# Patient Record
Sex: Female | Born: 1980 | Race: White | Hispanic: No | Marital: Married | Smoking: Never smoker
Health system: Southern US, Community
[De-identification: ages and names within clinical notes are randomized; demographics above are authoritative.]

## PROBLEM LIST (undated history)

## (undated) DIAGNOSIS — E039 Hypothyroidism, unspecified: Secondary | ICD-10-CM

## (undated) DIAGNOSIS — K529 Noninfective gastroenteritis and colitis, unspecified: Secondary | ICD-10-CM

## (undated) DIAGNOSIS — C50919 Malignant neoplasm of unspecified site of unspecified female breast: Secondary | ICD-10-CM

## (undated) HISTORY — DX: Noninfective gastroenteritis and colitis, unspecified: K52.9

## (undated) HISTORY — DX: Hypothyroidism, unspecified: E03.9

## (undated) HISTORY — PX: BREAST RECONSTRUCTION: SHX9

## (undated) HISTORY — DX: Malignant neoplasm of unspecified site of unspecified female breast: C50.919

## (undated) HISTORY — PX: MASTECTOMY: SHX3

---

## 2005-06-22 ENCOUNTER — Ambulatory Visit: Payer: Self-pay | Admitting: Family Medicine

## 2006-10-17 ENCOUNTER — Inpatient Hospital Stay (HOSPITAL_COMMUNITY): Admission: RE | Admit: 2006-10-17 | Discharge: 2006-10-19 | Payer: Self-pay | Admitting: Obstetrics and Gynecology

## 2007-02-05 ENCOUNTER — Ambulatory Visit: Payer: Self-pay | Admitting: Family Medicine

## 2007-03-06 ENCOUNTER — Telehealth (INDEPENDENT_AMBULATORY_CARE_PROVIDER_SITE_OTHER): Payer: Self-pay | Admitting: *Deleted

## 2007-03-12 ENCOUNTER — Encounter: Payer: Self-pay | Admitting: Family Medicine

## 2007-04-05 ENCOUNTER — Encounter: Payer: Self-pay | Admitting: Family Medicine

## 2009-10-18 ENCOUNTER — Inpatient Hospital Stay (HOSPITAL_COMMUNITY): Admission: AD | Admit: 2009-10-18 | Discharge: 2009-10-19 | Payer: Self-pay | Admitting: Obstetrics

## 2009-11-04 ENCOUNTER — Inpatient Hospital Stay (HOSPITAL_COMMUNITY): Admission: AD | Admit: 2009-11-04 | Discharge: 2009-11-06 | Payer: Self-pay | Admitting: Obstetrics & Gynecology

## 2010-04-22 ENCOUNTER — Ambulatory Visit: Payer: Self-pay | Admitting: Family Medicine

## 2010-06-16 NOTE — Assessment & Plan Note (Signed)
Summary: CONGESTED/KN   Vital Signs:  Patient profile:   30 year old female Height:      64 inches Weight:      132.2 pounds BMI:     22.77 O2 Sat:      99 % on Room air Temp:     98.9 degrees F oral Pulse rate:   66 / minute Pulse rhythm:   regular BP sitting:   110 / 70  (left arm) Cuff size:   regular  Vitals Entered By: Almeta Monas CMA Duncan Dull) (April 22, 2010 1:07 PM)  O2 Flow:  Room air CC: x 2weeks c/o cough, sore/swollen lymph nodes in the neck, sinus pressure, and yellow sputum, URI symptoms   History of Present Illness:       This is a 30 year old woman who presents with URI symptoms.  The symptoms began 3 weeks ago.  Pt has been using neti pot with some relief of pressure.   Dry cough makes her chest burn.  The patient complains of nasal congestion, purulent nasal discharge, sore throat, dry cough, and sick contacts, but denies clear nasal discharge, productive cough, and earache.  The patient denies fever, low-grade fever (<100.5 degrees), fever of 100.5-103 degrees, fever of 103.1-104 degrees, fever to >104 degrees, stiff neck, dyspnea, wheezing, rash, vomiting, diarrhea, use of an antipyretic, and response to antipyretic.  The patient denies itchy watery eyes, itchy throat, sneezing, seasonal symptoms, response to antihistamine, headache, muscle aches, and severe fatigue.  The patient denies the following risk factors for Strep sinusitis: unilateral facial pain, unilateral nasal discharge, poor response to decongestant, double sickening, tooth pain, Strep exposure, tender adenopathy, and absence of cough.    Current Medications (verified): 1)  Prenatal Vitamins .Marland Kitchen.. 1 By Mouth Once Daily 2)  Synthroid 75 Mcg Tabs (Levothyroxine Sodium) .Marland Kitchen.. 1 By Mouth Once Daily 3)  Ceftin 500 Mg Tabs (Cefuroxime Axetil) .Marland Kitchen.. 1 By Mouth Two Times A Day 4)  Delsym 30 Mg/80ml Lqcr (Dextromethorphan Polistirex)  Allergies (verified): No Known Drug Allergies  Review of Systems  See HPI  Physical Exam  General:  Well-developed,well-nourished,in no acute distress; alert,appropriate and cooperative throughout examination Ears:  External ear exam shows no significant lesions or deformities.  Otoscopic examination reveals clear canals, tympanic membranes are intact bilaterally without bulging, retraction, inflammation or discharge. Hearing is grossly normal bilaterally. Nose:  External nasal examination shows no deformity or inflammation. Nasal mucosa are pink and moist without lesions or exudates. Mouth:  Oral mucosa and oropharynx without lesions or exudates.  Teeth in good repair. Neck:  No deformities, masses, or tenderness noted. Lungs:  + scattered rhonci occassional wheeze Heart:  normal rate and no murmur.   Cervical Nodes:  No lymphadenopathy noted Psych:  Cognition and judgment appear intact. Alert and cooperative with normal attention span and concentration. No apparent delusions, illusions, hallucinations   Impression & Recommendations:  Problem # 1:  BRONCHITIS- ACUTE (ICD-466.0)  Take antibiotics and other medications as directed. Encouraged to push clear liquids, get enough rest, and take acetaminophen as needed. To be seen in 5-7 days if no improvement, sooner if worse.  Her updated medication list for this problem includes:    Ceftin 500 Mg Tabs (Cefuroxime axetil) .Marland Kitchen... 1 by mouth two times a day    Delsym 30 Mg/39ml Lqcr (Dextromethorphan polistirex)  Complete Medication List: 1)  Prenatal Vitamins  .Marland KitchenMarland Kitchen. 1 by mouth once daily 2)  Synthroid 75 Mcg Tabs (Levothyroxine sodium) .Marland Kitchen.. 1 by mouth  once daily 3)  Ceftin 500 Mg Tabs (Cefuroxime axetil) .Marland Kitchen.. 1 by mouth two times a day 4)  Delsym 30 Mg/39ml Lqcr (Dextromethorphan polistirex)  Patient Instructions: 1)  Acute Bronchitis symptoms for less then 10 days are not  helped by antibiotics. Take over the counter cough medications. Call if no improvement in 5-7 days, sooner if increasing cough, fever,  or new symptoms ( shortness of breath, chest pain) .  Prescriptions: CEFTIN 500 MG TABS (CEFUROXIME AXETIL) 1 by mouth two times a day  #20 x 0   Entered and Authorized by:   Loreen Freud DO   Signed by:   Loreen Freud DO on 04/22/2010   Method used:   Electronically to        [facility] (retail)       [address]       [place], [place]  [address]       Ph: [phone]       Fax: [phone]   RxID:   [id number]    Orders Added: 1)  Est. Patient Level III [60630]

## 2010-07-31 LAB — CBC
Hemoglobin: 12.6 g/dL (ref 12.0–15.0)
MCV: 93.6 fL (ref 78.0–100.0)
Platelets: 188 10*3/uL (ref 150–400)
Platelets: 195 10*3/uL (ref 150–400)
RBC: 3.92 MIL/uL (ref 3.87–5.11)
RBC: 4.16 MIL/uL (ref 3.87–5.11)
RDW: 13.4 % (ref 11.5–15.5)
WBC: 12.4 10*3/uL — ABNORMAL HIGH (ref 4.0–10.5)
WBC: 15.3 10*3/uL — ABNORMAL HIGH (ref 4.0–10.5)

## 2011-03-02 LAB — CBC
HCT: 33.2 — ABNORMAL LOW
HCT: 39
Hemoglobin: 11.1 — ABNORMAL LOW
Hemoglobin: 13
MCHC: 33.3
MCV: 91.4
MCV: 92.6
Platelets: 194
RBC: 3.59 — ABNORMAL LOW
RBC: 4.27
WBC: 10.5
WBC: 19.1 — ABNORMAL HIGH

## 2011-03-16 ENCOUNTER — Ambulatory Visit (INDEPENDENT_AMBULATORY_CARE_PROVIDER_SITE_OTHER): Payer: BC Managed Care – PPO | Admitting: Family Medicine

## 2011-03-16 ENCOUNTER — Encounter: Payer: Self-pay | Admitting: Family Medicine

## 2011-03-16 DIAGNOSIS — R197 Diarrhea, unspecified: Secondary | ICD-10-CM

## 2011-03-16 DIAGNOSIS — R109 Unspecified abdominal pain: Secondary | ICD-10-CM

## 2011-03-16 LAB — BASIC METABOLIC PANEL
BUN: 5 mg/dL — ABNORMAL LOW (ref 6–23)
CO2: 29 mEq/L (ref 19–32)
Calcium: 9.7 mg/dL (ref 8.4–10.5)
Glucose, Bld: 80 mg/dL (ref 70–99)
Sodium: 138 mEq/L (ref 135–145)

## 2011-03-16 LAB — CBC WITH DIFFERENTIAL/PLATELET
Basophils Absolute: 0 10*3/uL (ref 0.0–0.1)
Eosinophils Absolute: 0.2 10*3/uL (ref 0.0–0.7)
Hemoglobin: 14.3 g/dL (ref 12.0–15.0)
Lymphocytes Relative: 22.9 % (ref 12.0–46.0)
Lymphs Abs: 2.1 10*3/uL (ref 0.7–4.0)
MCHC: 33.8 g/dL (ref 30.0–36.0)
Neutro Abs: 5.9 10*3/uL (ref 1.4–7.7)
Platelets: 256 10*3/uL (ref 150.0–400.0)
RDW: 12.7 % (ref 11.5–14.6)

## 2011-03-16 LAB — HEPATIC FUNCTION PANEL
Albumin: 4.6 g/dL (ref 3.5–5.2)
Alkaline Phosphatase: 63 U/L (ref 39–117)
Total Bilirubin: 1.1 mg/dL (ref 0.3–1.2)

## 2011-03-16 LAB — AMYLASE: Amylase: 41 U/L (ref 27–131)

## 2011-03-16 MED ORDER — HYOSCYAMINE SULFATE 0.125 MG SL SUBL: 0.1250 mg | SUBLINGUAL_TABLET | SUBLINGUAL | Status: DC | PRN

## 2011-03-16 NOTE — Progress Notes (Signed)
  Subjective:     Anyia Gierke is a 30 y.o. female who presents for evaluation of diarrhea. Onset of diarrhea was 6 days ago. Diarrhea is occurring approximately 8 times per day. Patient describes diarrhea as meconium-colored. Diarrhea has been associated with weight loss of 7 lbs since last week.. Patient denies blood in stool, fever, illness in household contacts, recent antibiotic use, recent camping, recent travel, significant abdominal pain. Previous visits for diarrhea: none. Evaluation to date: none.  Treatment to date: immodium 2x.  The following portions of the patient's history were reviewed and updated as appropriate: allergies, current medications, past family history, past medical history, past social history, past surgical history and problem list.  Review of Systems Pertinent items are noted in HPI.    Objective:    BP 100/68  Pulse 66  Temp(Src) 98.9 F (37.2 C) (Oral)  Wt 122 lb (55.339 kg)  SpO2 98% General: alert, cooperative, appears stated age and no distress  Hydration:  well hydrated  Abdomen:    normal findings: bowel sounds normal, no masses palpable, no organomegaly and soft and abnormal findings:  mild tenderness in the entire abdomen   heme neg brown stool Assessment:    Diarrhea of uncertain etiology, moderate in severity   Plan:    Appropriate educational material discussed and distributed. Clear liquids for a few days. Discussed the appropriate management of diarrhea. Follow up as needed. Lab studies per orders. Medications per orders.

## 2011-03-16 NOTE — Patient Instructions (Signed)
Diarrhea Infections caused by germs (bacterial) or a virus commonly cause diarrhea. Your caregiver has determined that with time, rest and fluids, the diarrhea should improve. In general, eat normally while drinking more water than usual. Although water may prevent dehydration, it does not contain salt and minerals (electrolytes). Broths, weak tea without caffeine and oral rehydration solutions (ORS) replace fluids and electrolytes. Small amounts of fluids should be taken frequently. Large amounts at one time may not be tolerated. Plain water may be harmful in infants and the elderly. Oral rehydrating solutions (ORS) are available at pharmacies and grocery stores. ORS replace water and important electrolytes in proper proportions. Sports drinks are not as effective as ORS and may be harmful due to sugars worsening diarrhea.  ORS is especially recommended for use in children with diarrhea. As a general guideline for children, replace any new fluid losses from diarrhea and/or vomiting with ORS as follows:   If your child weighs 22 pounds or under (10 kg or less), give 60-120 mL ( -  cup or 2 - 4 ounces) of ORS for each episode of diarrheal stool or vomiting episode.   If your child weighs more than 22 pounds (more than 10 kgs), give 120-240 mL ( - 1 cup or 4 - 8 ounces) of ORS for each diarrheal stool or episode of vomiting.   While correcting for dehydration, children should eat normally. However, foods high in sugar should be avoided because this may worsen diarrhea. Large amounts of carbonated soft drinks, juice, gelatin desserts and other highly sugared drinks should be avoided.   After correction of dehydration, other liquids that are appealing to the child may be added. Children should drink small amounts of fluids frequently and fluids should be increased as tolerated. Children should drink enough fluids to keep urine clear or pale yellow.   Adults should eat normally while drinking more fluids  than usual. Drink small amounts of fluids frequently and increase as tolerated. Drink enough fluids to keep urine clear or pale yellow. Broths, weak decaffeinated tea, lemon lime soft drinks (allowed to go flat) and ORS replace fluids and electrolytes.   Avoid:   Carbonated drinks.   Juice.   Extremely hot or cold fluids.   Caffeine drinks.   Fatty, greasy foods.   Alcohol.   Tobacco.   Too much intake of anything at one time.   Gelatin desserts.   Probiotics are active cultures of beneficial bacteria. They may lessen the amount and number of diarrheal stools in adults. Probiotics can be found in yogurt with active cultures and in supplements.   Wash hands well to avoid spreading bacteria and virus.   Anti-diarrheal medications are not recommended for infants and children.   Only take over-the-counter or prescription medicines for pain, discomfort or fever as directed by your caregiver. Do not give aspirin to children because it may cause Reye's Syndrome.   For adults, ask your caregiver if you should continue all prescribed and over-the-counter medicines.   If your caregiver has given you a follow-up appointment, it is very important to keep that appointment. Not keeping the appointment could result in a chronic or permanent injury, and disability. If there is any problem keeping the appointment, you must call back to this facility for assistance.  SEEK IMMEDIATE MEDICAL CARE IF:   You or your child is unable to keep fluids down or other symptoms or problems become worse in spite of treatment.   Vomiting or diarrhea develops and becomes persistent.     There is vomiting of blood or bile (green material).   There is blood in the stool or the stools are black and tarry.   There is no urine output in 6-8 hours or there is only a small amount of very dark urine.   Abdominal pain develops, increases or localizes.   You have a fever.   Your baby is older than 3 months with a  rectal temperature of 102 F (38.9 C) or higher.   Your baby is 3 months old or younger with a rectal temperature of 100.4 F (38 C) or higher.   You or your child develops excessive weakness, dizziness, fainting or extreme thirst.   You or your child develops a rash, stiff neck, severe headache or become irritable or sleepy and difficult to awaken.  MAKE SURE YOU:   Understand these instructions.   Will watch your condition.   Will get help right away if you are not doing well or get worse.  Document Released: 04/21/2002 Document Revised: 01/11/2011 Document Reviewed: 03/08/2009 [employer] Patient Information 2012 [employer]. 

## 2011-03-17 LAB — POCT URINALYSIS DIPSTICK
Bilirubin, UA: NEGATIVE
Glucose, UA: NEGATIVE
Ketones, UA: NEGATIVE
Leukocytes, UA: NEGATIVE
Nitrite, UA: NEGATIVE

## 2011-03-20 ENCOUNTER — Other Ambulatory Visit: Payer: Self-pay | Admitting: Family Medicine

## 2011-03-21 LAB — CLOSTRIDIUM DIFFICILE EIA: CDIFTX: NEGATIVE

## 2011-03-22 ENCOUNTER — Encounter: Payer: Self-pay | Admitting: Family Medicine

## 2011-03-23 ENCOUNTER — Encounter: Payer: Self-pay | Admitting: Family Medicine

## 2011-03-23 ENCOUNTER — Ambulatory Visit (HOSPITAL_BASED_OUTPATIENT_CLINIC_OR_DEPARTMENT_OTHER)
Admission: RE | Admit: 2011-03-23 | Discharge: 2011-03-23 | Disposition: A | Discharge status: Home / Self Care | Payer: BC Managed Care – PPO | Source: Ambulatory Visit | Attending: Family Medicine | Admitting: Family Medicine

## 2011-03-23 ENCOUNTER — Ambulatory Visit (INDEPENDENT_AMBULATORY_CARE_PROVIDER_SITE_OTHER): Payer: BC Managed Care – PPO | Admitting: Family Medicine

## 2011-03-23 ENCOUNTER — Telehealth: Payer: Self-pay

## 2011-03-23 VITALS — BP 102/62 | HR 67 | Temp 98.7°F | Wt 119.2 lb

## 2011-03-23 DIAGNOSIS — R634 Abnormal weight loss: Secondary | ICD-10-CM

## 2011-03-23 DIAGNOSIS — R197 Diarrhea, unspecified: Secondary | ICD-10-CM

## 2011-03-23 DIAGNOSIS — R109 Unspecified abdominal pain: Secondary | ICD-10-CM | POA: Insufficient documentation

## 2011-03-23 MED ORDER — HYOSCYAMINE SULFATE 0.125 MG SL SUBL: SUBLINGUAL_TABLET | SUBLINGUAL | Status: DC

## 2011-03-23 MED ORDER — PREDNISONE (PAK) 10 MG PO TABS: 10.0000 mg | ORAL_TABLET | Freq: Every day | ORAL | Status: AC

## 2011-03-23 MED ORDER — METRONIDAZOLE 500 MG PO TABS: 500.0000 mg | ORAL_TABLET | Freq: Three times a day (TID) | ORAL | Status: DC

## 2011-03-23 MED ORDER — IOHEXOL 300 MG/ML  SOLN
100.0000 mL | Freq: Once | INTRAMUSCULAR | Status: AC | PRN
  Administered 2011-03-23: 100 mL via INTRAVENOUS

## 2011-03-23 NOTE — Telephone Encounter (Signed)
Labs reviewed with patient and she voiced understanding. Rx faxed to the pharmacy and she agreed to call with any concerns.     KP

## 2011-03-23 NOTE — Progress Notes (Signed)
  Subjective:     Choua Ikner is a 30 y.o. female who presents for evaluation of diarrhea. Onset of diarrhea was 3 weeks ago. Diarrhea is occurring approximately 7 times per day. Patient describes diarrhea as loose. Diarrhea has been associated with abdominal pain described as cramping, vomiting occurring 3 times and weight loss of 7 lbs since last visit. Patient denies blood in stool, fever, illness in household contacts, recent antibiotic use, recent camping, recent travel, significant abdominal pain. Previous visits for diarrhea: yes, last seen 2 weeks ago by me. Evaluation to date: CBC, electrolytes, BUN and creatinine and stool cultures.  Treatment to date: levsin.  The following portions of the patient's history were reviewed and updated as appropriate: allergies, current medications, past family history, past medical history, past social history, past surgical history and problem list.  Review of Systems Pertinent items are noted in HPI.    Objective:    BP 102/62  Pulse 67  Temp(Src) 98.7 F (37.1 C) (Oral)  Wt 119 lb 3.2 oz (54.069 kg)  SpO2 96% General: alert, cooperative and no distress  Hydration:  well hydrated  Abdomen:    soft, non-tender; bowel sounds normal; no masses,  no organomegaly    Assessment:    Diarrhea of uncertain etiology, moderate in severity   Plan:    Appropriate educational material discussed and distributed. Clear liquids for several days. Discussed the appropriate management of diarrhea. Follow up as needed. GI consult. CT abd today

## 2011-03-23 NOTE — Telephone Encounter (Signed)
Message copied by Arnette Norris on Thu Mar 23, 2011  4:30 PM ------      Message from: Lelon Perla      Created: Thu Mar 23, 2011  3:44 PM       ? Colitis----- flagyl 500 mg po tid for 10 days       Prednisone 10 mg  3 po for 3 days then 2 po qd for 3 days then 1 po qd for 3 days

## 2011-03-23 NOTE — Patient Instructions (Signed)
Diarrhea Infections caused by germs (bacterial) or a virus commonly cause diarrhea. Your caregiver has determined that with time, rest and fluids, the diarrhea should improve. In general, eat normally while drinking more water than usual. Although water may prevent dehydration, it does not contain salt and minerals (electrolytes). Broths, weak tea without caffeine and oral rehydration solutions (ORS) replace fluids and electrolytes. Small amounts of fluids should be taken frequently. Large amounts at one time may not be tolerated. Plain water may be harmful in infants and the elderly. Oral rehydrating solutions (ORS) are available at pharmacies and grocery stores. ORS replace water and important electrolytes in proper proportions. Sports drinks are not as effective as ORS and may be harmful due to sugars worsening diarrhea.  ORS is especially recommended for use in children with diarrhea. As a general guideline for children, replace any new fluid losses from diarrhea and/or vomiting with ORS as follows:   If your child weighs 22 pounds or under (10 kg or less), give 60-120 mL ( -  cup or 2 - 4 ounces) of ORS for each episode of diarrheal stool or vomiting episode.   If your child weighs more than 22 pounds (more than 10 kgs), give 120-240 mL ( - 1 cup or 4 - 8 ounces) of ORS for each diarrheal stool or episode of vomiting.   While correcting for dehydration, children should eat normally. However, foods high in sugar should be avoided because this may worsen diarrhea. Large amounts of carbonated soft drinks, juice, gelatin desserts and other highly sugared drinks should be avoided.   After correction of dehydration, other liquids that are appealing to the child may be added. Children should drink small amounts of fluids frequently and fluids should be increased as tolerated. Children should drink enough fluids to keep urine clear or pale yellow.   Adults should eat normally while drinking more fluids  than usual. Drink small amounts of fluids frequently and increase as tolerated. Drink enough fluids to keep urine clear or pale yellow. Broths, weak decaffeinated tea, lemon lime soft drinks (allowed to go flat) and ORS replace fluids and electrolytes.   Avoid:   Carbonated drinks.   Juice.   Extremely hot or cold fluids.   Caffeine drinks.   Fatty, greasy foods.   Alcohol.   Tobacco.   Too much intake of anything at one time.   Gelatin desserts.   Probiotics are active cultures of beneficial bacteria. They may lessen the amount and number of diarrheal stools in adults. Probiotics can be found in yogurt with active cultures and in supplements.   Wash hands well to avoid spreading bacteria and virus.   Anti-diarrheal medications are not recommended for infants and children.   Only take over-the-counter or prescription medicines for pain, discomfort or fever as directed by your caregiver. Do not give aspirin to children because it may cause Reye's Syndrome.   For adults, ask your caregiver if you should continue all prescribed and over-the-counter medicines.   If your caregiver has given you a follow-up appointment, it is very important to keep that appointment. Not keeping the appointment could result in a chronic or permanent injury, and disability. If there is any problem keeping the appointment, you must call back to this facility for assistance.  SEEK IMMEDIATE MEDICAL CARE IF:   You or your child is unable to keep fluids down or other symptoms or problems become worse in spite of treatment.   Vomiting or diarrhea develops and becomes persistent.     There is vomiting of blood or bile (green material).   There is blood in the stool or the stools are black and tarry.   There is no urine output in 6-8 hours or there is only a small amount of very dark urine.   Abdominal pain develops, increases or localizes.   You have a fever.   Your baby is older than 3 months with a  rectal temperature of 102 F (38.9 C) or higher.   Your baby is 3 months old or younger with a rectal temperature of 100.4 F (38 C) or higher.   You or your child develops excessive weakness, dizziness, fainting or extreme thirst.   You or your child develops a rash, stiff neck, severe headache or become irritable or sleepy and difficult to awaken.  MAKE SURE YOU:   Understand these instructions.   Will watch your condition.   Will get help right away if you are not doing well or get worse.  Document Released: 04/21/2002 Document Revised: 01/11/2011 Document Reviewed: 03/08/2009 [employer] Patient Information 2012 [employer]. 

## 2011-03-24 ENCOUNTER — Encounter: Payer: Self-pay | Admitting: Internal Medicine

## 2011-03-31 ENCOUNTER — Telehealth: Payer: Self-pay | Admitting: Family Medicine

## 2011-03-31 MED ORDER — FLUCONAZOLE 150 MG PO TABS: ORAL_TABLET | ORAL | Status: DC

## 2011-03-31 NOTE — Telephone Encounter (Signed)
Diflucan 150 mg #2  1 po qd x 1, may repeat in 3 days prn  

## 2011-03-31 NOTE — Telephone Encounter (Signed)
Rx sent---VM left making patient aware   KP

## 2011-03-31 NOTE — Telephone Encounter (Signed)
Please advise 

## 2011-04-03 ENCOUNTER — Ambulatory Visit (INDEPENDENT_AMBULATORY_CARE_PROVIDER_SITE_OTHER): Payer: BC Managed Care – PPO | Admitting: Internal Medicine

## 2011-04-03 ENCOUNTER — Encounter: Payer: Self-pay | Admitting: Internal Medicine

## 2011-04-03 DIAGNOSIS — K5289 Other specified noninfective gastroenteritis and colitis: Secondary | ICD-10-CM

## 2011-04-03 DIAGNOSIS — K529 Noninfective gastroenteritis and colitis, unspecified: Secondary | ICD-10-CM

## 2011-04-03 DIAGNOSIS — E039 Hypothyroidism, unspecified: Secondary | ICD-10-CM

## 2011-04-03 NOTE — Progress Notes (Signed)
  Subjective:    Patient ID: Carolyn Howell, female    DOB: 11-08-1980, 30 y.o.   MRN: [id number]  HPI Pleasant 30 year old married white woman who developed acute diarrhea in late October. She saw her primary care physician, diagnostic workup with stool studies and labs was unrevealing. A CT scan showed a left-sided colitis. She was treated with antibiotics to include metronidazole as well as a brief prednisone taper and she quickly responded to therapy. She does not have diarrhea now. There is some mild left lower quadrant soreness her pain left. She has not had any chronic recurrent bowel habits difficulties. This was a first-time experience. She did lose several pounds but has not regained that yet though she has leveled off. She had no foreign travel, recent antibiotic exposure, or sick contacts.  Outpatient Encounter Prescriptions as of 04/03/2011  Medication Sig Dispense Refill  . SYNTHROID 88 MCG tablet Take 1 tablet by mouth daily.       . hyoscyamine (LEVSIN/SL) 0.125 MG SL tablet 1-2 SL q4 hours prn diarrhea  60 tablet  0  . predniSONE (STERAPRED UNI-PAK) 10 MG tablet Take 1 tablet (10 mg total) by mouth daily. 3 po for 3 days then 2 po for 3 days then 1 po for 3 days  18 tablet  0  . DISCONTD: fluconazole (DIFLUCAN) 150 MG tablet Take one tablet today and repeat in 3 days as needed  2 tablet  0  . DISCONTD: metroNIDAZOLE (FLAGYL) 500 MG tablet Take 1 tablet (500 mg total) by mouth 3 (three) times daily.  30 tablet  0    Past Medical History  Diagnosis Date  . Colitis   . Hypothyroidism    Past Surgical History  Procedure Date  . None     reports that she has never smoked. She has never used smokeless tobacco. She reports that she does not drink alcohol or use illicit drugs. family history includes Breast cancer in her paternal grandmother and Diabetes in her maternal grandmother. No Known Allergies     Review of Systems Seasonal allergies, recent cough, cold. Headaches with  menses. Some urinary leakage at times. All other review of systems negative.    Objective:   Physical Exam General: Well-developed, well-nourished and in no acute distress Vitals: Reviewed and listed above Eyes:anicteric. Mouth and posterior pharynx: normal.  Neck: supple w/o thyromegaly or mass.  Lungs: clear. Heart: S1S2, no rubs, murmurs, gallops. Abdomen: soft, mildly tender in the left lower quadrant, no hepatosplenomegaly, hernia, or mass and BS+.  Lymphatics: no cervical, [place] or inguinal nodes.         Assessment & Plan:  Acute colitis as diagnosed by CT scan and clinical history. This is resolved after treatment with antibiotics and agree steroid taper. I favor this was probably an infectious colitis.  We discussed options of proceeding with endoscopic evaluation that could include a sigmoidoscopy or colonoscopy. We also discussed watchful waiting and having her call if she has a recurrent problem and that is the plan she chose. Reviewed lab studies, CT scan results as reflected in the EMR, all from November 2012.

## 2011-04-03 NOTE — Patient Instructions (Signed)
Call back and get a message to the nurse if you have any recurrent problems with diarrhea.

## 2012-12-05 ENCOUNTER — Emergency Department (INDEPENDENT_AMBULATORY_CARE_PROVIDER_SITE_OTHER)
Admission: EM | Admit: 2012-12-05 | Discharge: 2012-12-05 | Disposition: A | Discharge status: Home / Self Care | Payer: BC Managed Care – PPO | Source: Home / Self Care | Attending: Family Medicine | Admitting: Family Medicine

## 2012-12-05 ENCOUNTER — Encounter: Payer: Self-pay | Admitting: *Deleted

## 2012-12-05 DIAGNOSIS — R197 Diarrhea, unspecified: Secondary | ICD-10-CM

## 2012-12-05 DIAGNOSIS — R109 Unspecified abdominal pain: Secondary | ICD-10-CM

## 2012-12-05 LAB — POCT URINALYSIS DIPSTICK
Blood, UA: NEGATIVE
Glucose, UA: NEGATIVE
Nitrite, UA: NEGATIVE
Urobilinogen, UA: 0.2 (ref 0–1)

## 2012-12-05 LAB — POCT CBC W AUTO DIFF (K'VILLE URGENT CARE)

## 2012-12-05 MED ORDER — PREDNISONE 10 MG PO TABS: ORAL_TABLET | ORAL | Status: DC

## 2012-12-05 MED ORDER — METRONIDAZOLE 500 MG PO TABS: 500.0000 mg | ORAL_TABLET | Freq: Three times a day (TID) | ORAL | Status: DC

## 2012-12-05 NOTE — ED Notes (Signed)
Carolyn Howell c/o intermittent lower abdominal pain/cramping ranging in severity since 10/28/12. Accompanied by nausea and diarrhea. One week of constipation. She is scheduled to see GI end of August.

## 2012-12-05 NOTE — ED Provider Notes (Signed)
History    CSN: [id number] Arrival date & time 12/05/12  0907  First MD Initiated Contact with Patient 12/05/12 0932     Chief Complaint  Patient presents with  . Abdominal Pain      HPI Comments: While at [facility] with her family about 5 weeks ago, patient developed abdominal cramps and diarrhea that lasted a week.  She improved, and then one week later developed loose stools with mucous that has persisted.  She had one episode of nausea/vomiting.  She reports that she has diarrhea about 30 minutes after eating.  She feels fatigued, and has lost 3 to 4 pounds.  No blood in stool.  Denies recent foreign travel, or drinking untreated water in a wilderness environment.  No recent antibiotic use.  No one in family ill.  She has tried discontinuing wheat products without improvement. She had a similar condition in November, 2012.  CT abdomen and pelvis at that time showed wall thickening and hyperemia of the sigmoid colon.  Her symptoms resolved rapidly after a course of Flagyl and prednisone.  She subsequently visited GI but no studies performed because her symptoms had resolved.  She states that she now has another GI appt in August. She has a history of Hashimoto's thyroiditis and is presently taking Synthroid 137mcg/day.  She has not had TSH checked in about a year. Patient is a 32 y.o. female presenting with abdominal pain.  Abdominal Pain This is a new problem. Episode onset: 5 weeks ago. The problem occurs daily. The problem has not changed since onset.Associated symptoms include abdominal pain. Pertinent negatives include no chest pain, no headaches and no shortness of breath. The symptoms are aggravated by eating. Nothing relieves the symptoms. She has tried nothing for the symptoms.   Past Medical History  Diagnosis Date  . Colitis   . Hypothyroidism    Past Surgical History  Procedure Laterality Date  . None     Family History  Problem Relation Age of Onset  . Diabetes  Maternal Grandmother   . Breast cancer Paternal Grandmother   . Hypertension Mother    History  Substance Use Topics  . Smoking status: Never Smoker   . Smokeless tobacco: Never Used  . Alcohol Use: No   OB History   Grav Para Term Preterm Abortions TAB SAB Ect Mult Living                 Review of Systems  Constitutional: Positive for appetite change, fatigue and unexpected weight change.  HENT: Negative.   Eyes: Negative.   Respiratory: Negative.  Negative for shortness of breath.   Cardiovascular: Negative for chest pain.  Gastrointestinal: Positive for abdominal pain and diarrhea. Negative for nausea, vomiting, constipation, blood in stool and abdominal distention.  Genitourinary: Negative.   Musculoskeletal: Negative.   Skin: Negative.   Neurological: Negative for headaches.  All other systems reviewed and are negative.    Allergies  Review of patient's allergies indicates no known allergies.  Home Medications   Current Outpatient Rx  Name  Route  Sig  Dispense  Refill  . hyoscyamine (LEVSIN/SL) 0.125 MG SL tablet      1-2 SL q4 hours prn diarrhea   60 tablet   0   . metroNIDAZOLE (FLAGYL) 500 MG tablet   Oral   Take 1 tablet (500 mg total) by mouth 3 (three) times daily.   21 tablet   0   . predniSONE (DELTASONE) 10 MG tablet  Take 2 tabs by mouth twice daily for three days, then one tab twice daily for 3 days, then 1 tab daily for two days.  Take PC   20 tablet   0   . SYNTHROID 88 MCG tablet   Oral   Take 1 tablet by mouth daily.           BP 118/79  Pulse 62  Temp(Src) 98 F (36.7 C) (Oral)  Resp 14  Ht 5\' 3"  (1.6 m)  Wt 121 lb (54.885 kg)  BMI 21.44 kg/m2  SpO2 100%  LMP 11/25/2012 Physical Exam Nursing notes and Vital Signs reviewed. Appearance:  Patient appears healthy, stated age, and in no acute distress Eyes:  Pupils are equal, round, and reactive to light and accomodation.  Extraocular movement is intact.  Conjunctivae are  not inflamed   Nose:   Normal  Pharynx:  Normal; moist mucous membranes  Neck:  Supple.   No adenopathy Lungs:  Clear to auscultation.  Breath sounds are equal.  Heart:  Regular rate and rhythm without murmurs, rubs, or gallops.  Abdomen:  Vague mild peri-umbilical tenderness without masses or hepatosplenomegaly.  Bowel sounds are present.  No CVA or flank tenderness.  Extremities:  No edema.  No calf tenderness Skin:  No rash present.   ED Course  Procedures  None   Labs Reviewed  TSH pending  COMPLETE METABOLIC PANEL WITH GFR pending  SEDIMENTATION RATE pending  POCT CBC W AUTO DIFF ([facility]) WBC 4.9; LY 41.9; MO 8.8; GR 49.3; Hgb 13.9; Platelets 241   POCT URINALYSIS DIPSTICK normal    1. Diarrhea; ?colitis.   Normal WBC reassuring.  Previous similar episode November, 2012, responded well to Flagyl and prednisone.  Will begin Rx for same.    MDM  Chart reviewed. Begin Flagyl 500mg  TID, and prednisone taper. Sed rate, TSH, & CMP pending Begin clear liquids for one day, then may begin a BRAT diet for a day.  Then gradually advance to a regular diet as tolerated.  Avoid milk products until well. Followup with gastroenterologist and endocrinologist as scheduled   If symptoms become significantly worse during the night or over the weekend, proceed to the local emergency room.   Lattie Haw, MD 12/05/12 1214

## 2012-12-06 LAB — COMPLETE METABOLIC PANEL WITH GFR
Alkaline Phosphatase: 51 U/L (ref 39–117)
GFR, Est Non African American: >89 mL/min
Glucose, Bld: 85 mg/dL (ref 70–99)
Sodium: 138 mEq/L (ref 135–145)
Total Bilirubin: 0.8 mg/dL (ref 0.3–1.2)
Total Protein: 7.5 g/dL (ref 6.0–8.3)

## 2012-12-06 LAB — SEDIMENTATION RATE: Sed Rate: 1 mm/hr (ref 0–22)

## 2012-12-07 ENCOUNTER — Telehealth: Payer: Self-pay | Admitting: Family Medicine

## 2012-12-09 ENCOUNTER — Telehealth: Payer: Self-pay | Admitting: *Deleted

## 2013-01-08 ENCOUNTER — Ambulatory Visit (INDEPENDENT_AMBULATORY_CARE_PROVIDER_SITE_OTHER): Payer: BC Managed Care – PPO | Admitting: Family Medicine

## 2013-01-08 ENCOUNTER — Encounter: Payer: Self-pay | Admitting: Family Medicine

## 2013-01-08 VITALS — BP 102/68 | HR 67 | Ht 62.5 in | Wt 120.0 lb

## 2013-01-08 DIAGNOSIS — R197 Diarrhea, unspecified: Secondary | ICD-10-CM

## 2013-01-08 MED ORDER — HYOSCYAMINE SULFATE 0.125 MG SL SUBL: SUBLINGUAL_TABLET | SUBLINGUAL | Status: DC

## 2013-01-08 MED ORDER — CHOLESTYRAMINE LIGHT 4 G PO PACK: 4.0000 g | PACK | Freq: Three times a day (TID) | ORAL | Status: DC

## 2013-01-08 NOTE — Progress Notes (Addendum)
  Subjective:    Patient ID: Carolyn Howell, female    DOB: 10/12/1980, 32 y.o.   MRN: [id number]  HPI  Carolyn Howell is here today to establish care with our practice.  She was referred to [place] by her friend Alphonzo Dublin).  Overall, she feels that her health has been good.  She did develop some colitis a few years ago that resolved after 1-2 weeks.  She developed similar symptoms back in July and has struggled with diarrhea ever since.  At first, she thought that she add contracted a "bug" from her kids.  Her symptoms worsened and she has had a pretty extensive workup since July.  She is scheduled to go in for a colonoscopy next week.    Review of Systems  Constitutional: Negative.   HENT: Negative.   Eyes: Negative.   Respiratory: Negative.   Cardiovascular: Negative.   Gastrointestinal: Negative.   Endocrine: Negative.   Genitourinary: Negative.   Musculoskeletal: Negative.   Skin: Negative.   Allergic/Immunologic: Negative.   Neurological: Negative.   Hematological: Negative.   Psychiatric/Behavioral: Negative.     Past Medical History  Diagnosis Date  . Colitis   . Hypothyroidism      Family History  Problem Relation Age of Onset  . Diabetes Maternal Grandmother   . Breast cancer Paternal Grandmother   . Hypertension Mother   . Hyperlipidemia Father     History   Social History Narrative   Marital Status:  Married Museum/gallery curator)    Children:  G2 Son and Daughter Trinna Post and [place])    Pets: Dog (1)    Living Situation: Lives with husband and children    Occupation:  Homemaker    Education:  Oncologist in Social Work    Tobacco Use/Exposure:  None    Alcohol Use:  Occasional   Drug Use:  None   Diet:  [place]ut[place] Free (trying to adapt to this diet)    Exercise:  Running, biking, swimming, cross-fit, body pump - Every Day    Hobbies:  Working Out                 Objective:   Physical Exam  Vitals reviewed. Constitutional: She is oriented to person, place, and time.  She appears well-developed and well-nourished. No distress.  Cardiovascular: Normal rate and regular rhythm.   Pulmonary/Chest: Breath sounds normal.  Abdominal: Soft. Bowel sounds are normal. She exhibits no distension and no mass. There is no tenderness. There is no rebound and no guarding.  Neurological: She is alert and oriented to person, place, and time.  Skin: Skin is warm and dry.  Psychiatric: She has a normal mood and affect.      Assessment & Plan:

## 2013-01-08 NOTE — Patient Instructions (Addendum)
1)  Stool Studies to rule out any infectious agent causing this diarrhea.   2)  After the study then start on Align 1 per day.    3)  Cholecystyramine - 4 gram packet up to 3 per day  4)  Iron 65/Calcium with Vitamin D (help loose stools)   5)  Rice/Veggies   6)  Levsin as needed   Chronic Diarrhea Diarrhea is loose, watery stools. Having diarrhea means passing loose stools 3 or more times a day. Diarrhea that lasts longer than 4 weeks is considered long-lasting (chronic). Symptoms of chronic diarrhea may be continual or may come and go. People of all ages can get diarrhea. Body fluid loss (dehydration) may occur as a result of diarrhea. This means the body does not have as many fluids and salts (electrolytes) as it needs. CAUSES  There are many causes of chronic diarrhea. Causes may be different for children and adults. The various causes can be grouped into 2 categories: diarrhea caused by an infection and diarrhea not caused by an infection. Sometimes, the cause is unknown. Diarrhea caused by an infection may result from:  Parasites.  Bacteria.  Viral infections. Diarrhea not caused by an infection may result from:  Irritable bowel syndrome.  Reaction to medicines, such as antibiotics, cancer drugs, blood pressure medicines, and antacids.  Intestinal disease (Crohn's disease, ulcerative colitis, celiac disease).  Food allergies or sensitivity to additives (fructose, lactose, sugar substitutes).  Tumors.  Diabetes, thyroid disease, and other endocrine diseases.  Reduced blood flow to the intestine.  Previous surgery or radiation of the abdomen or gastrointestinal tract. Risk factors for chronic diarrhea include:  Having a severely weakened immune system, such as from HIV/AIDS.  Taking certain types of cancer-fighting drugs (chemotherapy) or other medicines.  A recent organ transplant.  Having a portion of the stomach removed.  Traveling to countries where food and  water supplies are often contaminated. SYMPTOMS  In addition to frequent, loose stools, diarrhea may cause:  Cramping.  Abdominal pain.  Nausea.  Urgent need to use the bathroom, or loss of bowel control. If dehydration occurs, problems include:  Thirst.  Less frequent urination.  Dark urine.  Dry skin.  Fatigue.  Dizziness. Infections that cause diarrhea may also cause a fever, chills, or bloody stools. DIAGNOSIS  Diagnosis may be difficult. Your caregiver must take a careful history and perform a physical exam. Tests given are based on your symptoms and history. Tests may include:  Blood or stool tests, in which 3 or more stool samples may be examined. Stool cultures may be used to test for bacteria or parasites.  X-rays.  A procedure in which a thin tube is inserted into the mouth or rectum (endoscopy). This allows the caregiver to look inside the intestine. TREATMENT   Diarrhea caused by an infection can often be treated with antibiotics.  Diarrhea not caused by an infection is more difficult to diagnose and treat. Long-term medicine use or surgery may be required. Specific treatment should be discussed with your caregiver.  If the cause cannot determined, treatment to relieve symptoms includes:  Preventing dehydration. Serious health problems can occur if you do not maintain proper fluid levels. Many oral rehydration solutions (ORS) are available at drug stores. Ask your caregiver what product is best for you.  Not drinking beverages that contain caffeine (tea, coffee, soft drinks).  Not drinking alcohol. It causes dehydration.  Not relying on sports drinks and broths alone to maintain proper fluid levels. They  should not be used to prevent severe dehydration.  Maintaining well-balanced nutrition. This may help you recover faster. PREVENTION   Drink clean or purified water.  Use proper food handling techniques.  Maintain proper hand-washing habits. HOME  CARE INSTRUCTIONS   Avoid:  Caffeine.  Greasy foods.  High fiber.  If you have problems digesting lactose during or after an episode of diarrhea, you might want to try yogurt. Yogurt is often better tolerated, because it has less lactose than milk. Yogurt with active, live bacterial cultures may even help you recover faster. SEEK MEDICAL CARE IF:  The person with diarrhea is an otherwise healthy adult and has:  Signs of dehydration.  Diarrhea for more than 2 days.  Severe pain in the abdomen or rectum.  An oral temperature above 102 F (38.9 C).  Stools containing blood or pus.  Stools that are black and tarry. SEEK IMMEDIATE MEDICAL CARE IF:  The person with diarrhea is a child, elderly person, or has a weakened immune system and has:  Signs of dehydration.  Diarrhea for more than 1 day.  Severe pain in the abdomen or rectum.  An oral temperature above 102 F (38.9 C), not controlled by medicine.  Stools containing blood or pus.  Stools that are black and tarry. Document Released: 07/22/2003 Document Revised: 07/24/2011 Document Reviewed: 09/17/2009 [facility] Patient Information 2014 [place], [place].

## 2013-01-09 ENCOUNTER — Other Ambulatory Visit: Payer: Self-pay | Admitting: *Deleted

## 2013-01-09 DIAGNOSIS — R109 Unspecified abdominal pain: Secondary | ICD-10-CM

## 2013-01-09 DIAGNOSIS — R197 Diarrhea, unspecified: Secondary | ICD-10-CM

## 2013-01-10 LAB — OVA AND PARASITE SCREEN: OP: NONE SEEN

## 2013-01-10 LAB — CLOSTRIDIUM DIFFICILE EIA: CDIFTX: NEGATIVE

## 2013-01-13 LAB — STOOL CULTURE

## 2013-01-14 ENCOUNTER — Telehealth: Payer: Self-pay | Admitting: *Deleted

## 2013-01-14 NOTE — Telephone Encounter (Signed)
Carolyn Howell is aware that her stool cultures were negative-eh

## 2013-01-15 ENCOUNTER — Encounter: Payer: BC Managed Care – PPO | Admitting: Family Medicine

## 2013-02-02 DIAGNOSIS — R197 Diarrhea, unspecified: Secondary | ICD-10-CM | POA: Insufficient documentation

## 2013-02-02 NOTE — Assessment & Plan Note (Signed)
We had a long discussion about her diarrhea and the various things that she can do to help with this problem.  We'll start with stool studies to be sure that she is not growing anything like C. Diff that we can kill.

## 2013-02-03 ENCOUNTER — Ambulatory Visit (INDEPENDENT_AMBULATORY_CARE_PROVIDER_SITE_OTHER): Payer: BC Managed Care – PPO | Admitting: Family Medicine

## 2013-02-03 VITALS — BP 110/72 | HR 66 | Resp 16 | Ht 62.5 in | Wt 118.0 lb

## 2013-02-03 DIAGNOSIS — K589 Irritable bowel syndrome without diarrhea: Secondary | ICD-10-CM

## 2013-02-03 DIAGNOSIS — Z23 Encounter for immunization: Secondary | ICD-10-CM

## 2013-02-03 DIAGNOSIS — F410 Panic disorder [episodic paroxysmal anxiety] without agoraphobia: Secondary | ICD-10-CM

## 2013-02-03 DIAGNOSIS — Z0000 Encounter for general adult medical examination without abnormal findings: Secondary | ICD-10-CM

## 2013-02-03 LAB — POCT URINALYSIS DIPSTICK
Bilirubin, UA: NEGATIVE
Blood, UA: NEGATIVE
Glucose, UA: NEGATIVE
Ketones, UA: NEGATIVE
Leukocytes, UA: NEGATIVE
Nitrite, UA: NEGATIVE
Protein, UA: NEGATIVE
Spec Grav, UA: 1.015
Urobilinogen, UA: NEGATIVE
pH, UA: 5

## 2013-02-03 MED ORDER — ESCITALOPRAM OXALATE 5 MG PO TABS: ORAL_TABLET | ORAL | Status: DC

## 2013-02-03 MED ORDER — CLONAZEPAM 1 MG PO TABS: ORAL_TABLET | ORAL | Status: DC

## 2013-02-03 NOTE — Patient Instructions (Signed)
1)  Diarrhea   Calcium with Vitamin D  Iron 65 mg Questran (Powder) Levsin  Align (Probiotic)   2)  Anxiety  Try 2.5 - 5 mg of the Lexapro Take 1/2  - 1 of the Klonopin before your plane ride.     Anxiety and Panic Attacks Your caregiver has informed you that you are having an anxiety or panic attack. There may be many forms of this. Most of the time these attacks come suddenly and without warning. They come at any time of day, including periods of sleep, and at any time of life. They may be strong and unexplained. Although panic attacks are very scary, they are physically harmless. Sometimes the cause of your anxiety is not known. Anxiety is a protective mechanism of the body in its fight or flight mechanism. Most of these perceived danger situations are actually nonphysical situations (such as anxiety over losing a job). CAUSES  The causes of an anxiety or panic attack are many. Panic attacks may occur in otherwise healthy people given a certain set of circumstances. There may be a genetic cause for panic attacks. Some medications may also have anxiety as a side effect. SYMPTOMS  Some of the most common feelings are:  Intense terror.  Dizziness, feeling faint.  Hot and cold flashes.  Fear of going crazy.  Feelings that nothing is real.  Sweating.  Shaking.  Chest pain or a fast heartbeat (palpitations).  Smothering, choking sensations.  Feelings of impending doom and that death is near.  Tingling of extremities, this may be from over-breathing.  Altered reality (derealization).  Being detached from yourself (depersonalization). Several symptoms can be present to make up anxiety or panic attacks. DIAGNOSIS  The evaluation by your caregiver will depend on the type of symptoms you are experiencing. The diagnosis of anxiety or panic attack is made when no physical illness can be determined to be a cause of the symptoms. TREATMENT  Treatment to prevent anxiety and panic  attacks may include:  Avoidance of circumstances that cause anxiety.  Reassurance and relaxation.  Regular exercise.  Relaxation therapies, such as yoga.  Psychotherapy with a psychiatrist or therapist.  Avoidance of caffeine, alcohol and illegal drugs.  Prescribed medication. SEEK IMMEDIATE MEDICAL CARE IF:   You experience panic attack symptoms that are different than your usual symptoms.  You have any worsening or concerning symptoms. Document Released: 05/01/2005 Document Revised: 07/24/2011 Document Reviewed: 09/02/2009 [facility] Patient Information 2014 [place], [place].

## 2013-02-03 NOTE — Progress Notes (Addendum)
Subjective:    Carolyn Howell ID: Carolyn Howell, female    DOB: 04-07-1981, 32 y.o.   MRN: [id number]  HPI  Carolyn Howell is here today for Carolyn Howell annual CPE and to discuss the conditions listed below:   1)  GI Problems:  Carolyn Howell diarrhea has improved somewhat since Carolyn Howell last office visit.  Carolyn Howell continues to have abdominal pain.  Carolyn Howell is going out of town soon and is nervous that Carolyn Howell will have symptoms when Carolyn Howell is out of town.    2)  Anxiety:  Carolyn Howell continues to worry about having a panic attack.      Review of Systems  Constitutional: Negative.   HENT: Negative.   Eyes: Negative.   Respiratory: Negative.   Cardiovascular: Negative.   Gastrointestinal: Positive for abdominal pain. Negative for diarrhea.       Improved.   Endocrine: Negative.   Genitourinary: Negative.   Musculoskeletal: Negative.   Skin: Negative.   Allergic/Immunologic: Negative.   Neurological: Negative.   Hematological: Negative.   Psychiatric/Behavioral: The Carolyn Howell is nervous/anxious (Carolyn Howell is nervous about having panic attacks.  ).      Past Medical History  Diagnosis Date  . Colitis   . Hypothyroidism      Family History  Problem Relation Age of Onset  . Diabetes Maternal Grandmother   . Breast cancer Paternal Grandmother   . Hypertension Mother   . Hyperlipidemia Father      History   Social History Narrative   Marital Status:  Married Museum/gallery curator)    Children:  G2 Son and Daughter Trinna Post and Eastman)    Pets: Dog (1)    Living Situation: Lives with husband and children    Occupation:  Homemaker    Education:  Oncologist in Social Work    Tobacco Use/Exposure:  None    Alcohol Use:  Occasional   Drug Use:  None   Diet:  Glutten Free (trying to adapt to this diet)    Exercise:  Running, biking, swimming, cross-fit, body pump - Every Day    Hobbies:  Working Out                Objective:   Physical Exam  Nursing note and vitals reviewed. Constitutional: Carolyn Howell is oriented to person, place, and time. Carolyn Howell  appears well-developed and well-nourished. No distress.  HENT:  Head: Normocephalic.  Nose: Nose normal.  Mouth/Throat: Oropharynx is clear and moist.  Eyes: Conjunctivae are normal. No scleral icterus.  Neck: Neck supple. No thyromegaly present.  Cardiovascular: Normal rate, regular rhythm and normal heart sounds.   No murmur heard. Pulmonary/Chest: Effort normal and breath sounds normal. Carolyn Howell exhibits no tenderness. Right breast exhibits no inverted nipple, no mass, no nipple discharge, no skin change and no tenderness. Left breast exhibits mass. Left breast exhibits no inverted nipple, no nipple discharge, no skin change and no tenderness. Breasts are symmetrical.    Abdominal: Soft. Bowel sounds are normal. Carolyn Howell exhibits no distension and no mass. There is no tenderness. There is no guarding.  Musculoskeletal: Carolyn Howell exhibits no edema and no tenderness.  Lymphadenopathy:    Carolyn Howell has no cervical adenopathy.  Neurological: Carolyn Howell is alert and oriented to person, place, and time.  Skin: Skin is warm and dry. No rash noted. No erythema.  Psychiatric: Carolyn Howell behavior is normal. Judgment and thought content normal.  Anxious      Assessment & Plan:    Aysia was seen today for annual exam.  Diagnoses and associated orders for this  visit:  Routine general medical examination at a health care facility Comments: We discussed preventative issues for Carolyn Howell age.  Carolyn Howell had a pap last year.  Carolyn Howell is under the impression Carolyn Howell is negative for HPV and does not need a pap for 3 years.  Nyilah has dense breasts.  There was increased thickening noted at the 12 o'clock position on Carolyn Howell left breast.  The thickness lessened with palpation.  The area was pointed out to the Carolyn Howell. Carolyn Howell was encouraged to decrease Carolyn Howell intake of caffeine and check Carolyn Howell breasts after Carolyn Howell period to see if this area becomes less noticeable. We'll send Carolyn Howell for imaging if it does not improve.     - POCT urinalysis dipstick  Need for prophylactic  vaccination and inoculation against influenza - Flu Vaccine QUAD 36+ mos PF IM (Fluarix)  IBS (irritable bowel syndrome) Comments: We discussed several things Carolyn Howell can try to limit Carolyn Howell diarrhea and abdominal pain.      Panic attack Comments: Carolyn Howell is to start on Lexapro for Carolyn Howell anxiety/panic attacks.  Carolyn Howell is to start with 1/2 tab for a week or so and increase to 1 tab.  Carolyn Howell was also given Klonopin to take prior to Carolyn Howell plane ride.  Carolyn Howell is to F/U in one month to see how Carolyn Howell is doing.  We may increase Carolyn Howell to 10 mg at that time if needed.     - clonazePAM (KLONOPIN) 1 MG tablet; Take 1/2 - 1 tab prior to plane ride. -     escitalopram (LEXAPRO) 5 MG tablet; Take 1/2 - 1 tab po daily

## 2013-04-01 ENCOUNTER — Other Ambulatory Visit: Payer: Self-pay | Admitting: *Deleted

## 2013-04-01 DIAGNOSIS — E038 Other specified hypothyroidism: Secondary | ICD-10-CM

## 2013-04-01 MED ORDER — LEVOTHYROXINE SODIUM 100 MCG PO TABS: 100.0000 ug | ORAL_TABLET | Freq: Every day | ORAL | Status: DC

## 2013-04-02 ENCOUNTER — Other Ambulatory Visit: Payer: BC Managed Care – PPO

## 2013-04-02 LAB — COMPLETE METABOLIC PANEL WITH GFR
ALT: <8 U/L (ref 0–35)
AST: 14 U/L (ref 0–37)
Albumin: 4.8 g/dL (ref 3.5–5.2)
Alkaline Phosphatase: 49 U/L (ref 39–117)
BUN: 7 mg/dL (ref 6–23)
CO2: 28 mEq/L (ref 19–32)
Calcium: 9.5 mg/dL (ref 8.4–10.5)
Chloride: 102 mEq/L (ref 96–112)
Creat: 0.64 mg/dL (ref 0.50–1.10)
GFR, Est African American: >89 mL/min
GFR, Est Non African American: >89 mL/min
Glucose, Bld: 81 mg/dL (ref 70–99)
Potassium: 4.1 mEq/L (ref 3.5–5.3)
Sodium: 137 mEq/L (ref 135–145)
Total Bilirubin: 0.5 mg/dL (ref 0.3–1.2)
Total Protein: 7.2 g/dL (ref 6.0–8.3)

## 2013-04-02 LAB — TSH: TSH: 1.676 u[IU]/mL (ref 0.350–4.500)

## 2013-04-13 ENCOUNTER — Encounter: Payer: Self-pay | Admitting: Family Medicine

## 2013-04-13 DIAGNOSIS — Z0000 Encounter for general adult medical examination without abnormal findings: Secondary | ICD-10-CM | POA: Insufficient documentation

## 2013-04-13 DIAGNOSIS — Z23 Encounter for immunization: Secondary | ICD-10-CM | POA: Insufficient documentation

## 2013-04-13 NOTE — Assessment & Plan Note (Signed)
The patient confirmed that they are not allergic to eggs and have never had a bad reaction with the flu shot in the past.  The vaccination was given without difficulty.   

## 2013-04-15 DIAGNOSIS — F410 Panic disorder [episodic paroxysmal anxiety] without agoraphobia: Secondary | ICD-10-CM | POA: Insufficient documentation

## 2013-04-15 DIAGNOSIS — K589 Irritable bowel syndrome without diarrhea: Secondary | ICD-10-CM | POA: Insufficient documentation

## 2013-04-16 ENCOUNTER — Ambulatory Visit (INDEPENDENT_AMBULATORY_CARE_PROVIDER_SITE_OTHER): Payer: BC Managed Care – PPO | Admitting: Family Medicine

## 2013-04-16 ENCOUNTER — Encounter (INDEPENDENT_AMBULATORY_CARE_PROVIDER_SITE_OTHER): Payer: Self-pay

## 2013-04-16 ENCOUNTER — Other Ambulatory Visit: Payer: Self-pay | Admitting: Family Medicine

## 2013-04-16 ENCOUNTER — Encounter: Payer: Self-pay | Admitting: Family Medicine

## 2013-04-16 VITALS — BP 103/73 | HR 59 | Resp 16 | Wt 116.0 lb

## 2013-04-16 DIAGNOSIS — K589 Irritable bowel syndrome without diarrhea: Secondary | ICD-10-CM

## 2013-04-16 DIAGNOSIS — N63 Unspecified lump in unspecified breast: Secondary | ICD-10-CM

## 2013-04-16 DIAGNOSIS — N920 Excessive and frequent menstruation with regular cycle: Secondary | ICD-10-CM

## 2013-04-16 DIAGNOSIS — E039 Hypothyroidism, unspecified: Secondary | ICD-10-CM

## 2013-04-16 DIAGNOSIS — N6325 Unspecified lump in the left breast, overlapping quadrants: Secondary | ICD-10-CM

## 2013-04-16 DIAGNOSIS — F411 Generalized anxiety disorder: Secondary | ICD-10-CM

## 2013-04-16 MED ORDER — ESCITALOPRAM OXALATE 5 MG PO TABS: ORAL_TABLET | ORAL | Status: DC

## 2013-04-16 MED ORDER — TRANEXAMIC ACID 650 MG PO TABS: 1300.0000 mg | ORAL_TABLET | Freq: Three times a day (TID) | ORAL | Status: DC

## 2013-04-16 MED ORDER — LEVOTHYROXINE SODIUM 100 MCG PO TABS: 100.0000 ug | ORAL_TABLET | Freq: Every day | ORAL | Status: AC

## 2013-04-16 NOTE — Progress Notes (Addendum)
Subjective:    Patient ID: Carolyn Howell, female    DOB: 05-08-1981, 32 y.o.   MRN: [id number]  HPI  Carolyn Howell is here today to go over her most recent lab results and to discuss the conditions listed below:   1)  Mood:  Her mood/anxiety is much better on Lexapro.  She felt better on the 2.5 mg and never increased to the 5 mg.  She would like to continue on this medication.  She did well on her trip to the [facility].       2)  Hypothyroidism:  She continues to do well on her levothyroxine (100 mcg, once daily) and needs to have this refilled.     3)  Heavy Menstrual Cycles:  She needs a refill on her Lysteda.   4)  Breast Lump - She decreased her intake of caffeine after her last visit but has not noticed a decrease in the lump in her breast.  She thinks that the lump may be more noticeable since she lost weight this summer.  She would like to have the lump rechecked today.     Review of Systems  Constitutional: Negative for fatigue and unexpected weight change.  Gastrointestinal: Negative for diarrhea (Imroved).  Psychiatric/Behavioral: Negative for dysphoric mood. The patient is not nervous/anxious.        Mood has improved on Lexapro.    Breast: Palpable lump in left breast.     Past Medical History  Diagnosis Date  . Colitis   . Hypothyroidism      Past Surgical History  Procedure Laterality Date  . None       History   Social History Narrative   Marital Status:  Married Museum/gallery curator)    Children:  G2 Son and Daughter Trinna Post and Carlin)    Pets: Dog (1)    Living Situation: Lives with husband and children    Occupation:  Homemaker    Education:  Oncologist in Social Work    Tobacco Use/Exposure:  None    Alcohol Use:  Occasional   Drug Use:  None   Diet:  Glutten Free (trying to adapt to this diet)    Exercise:  Running, biking, swimming, cross-fit, body pump - Every Day    Hobbies:  Working Out               Family History  Problem Relation Age of  Onset  . Diabetes Maternal Grandmother   . Breast cancer Paternal Grandmother   . Hypertension Mother   . Hyperlipidemia Father      No current outpatient prescriptions on file prior to visit.   No current facility-administered medications on file prior to visit.     No Known Allergies   Immunization History  Administered Date(s) Administered  . Influenza,inj,Quad PF,36+ Mos 02/03/2013      Objective:   Physical Exam  Vitals reviewed. Pulmonary/Chest:        Assessment & Plan:    Deane was seen today for medication management.   Diagnoses and associated orders for this visit:  IBS (irritable bowel syndrome) - Improved   Anxiety state, unspecified - Improved  - escitalopram (LEXAPRO) 5 MG tablet; Take 1/2 - 1 tab po daily  Unspecified hypothyroidism - levothyroxine (SYNTHROID, LEVOTHROID) 100 MCG tablet; Take 1 tablet (100 mcg total) by mouth daily before breakfast. Patient prefers brand name  Menorrhagia - tranexamic acid (LYSTEDA) 650 MG TABS tablet; Take 2 tablets (1,300 mg total) by mouth 3 (three)  times daily.  Breast lump on left side at 12 o'clock position - Sending for a diagnostic mammogram to evaluate the lump in her left breast .  - MM Digital Diagnostic Bilat; Future   TIME SPENT "FACE TO FACE" WITH PATIENT -  30 MINS

## 2013-04-22 ENCOUNTER — Other Ambulatory Visit: Payer: Self-pay | Admitting: Family Medicine

## 2013-04-22 DIAGNOSIS — N6325 Unspecified lump in the left breast, overlapping quadrants: Secondary | ICD-10-CM

## 2013-04-26 ENCOUNTER — Other Ambulatory Visit: Payer: Self-pay | Admitting: Family Medicine

## 2013-04-28 ENCOUNTER — Ambulatory Visit
Admission: RE | Admit: 2013-04-28 | Discharge: 2013-04-28 | Disposition: A | Discharge status: Home / Self Care | Payer: BC Managed Care – PPO | Source: Ambulatory Visit | Attending: Family Medicine | Admitting: Family Medicine

## 2013-04-28 ENCOUNTER — Other Ambulatory Visit: Payer: Self-pay | Admitting: Family Medicine

## 2013-04-28 DIAGNOSIS — N6325 Unspecified lump in the left breast, overlapping quadrants: Secondary | ICD-10-CM

## 2013-04-29 DIAGNOSIS — N920 Excessive and frequent menstruation with regular cycle: Secondary | ICD-10-CM | POA: Insufficient documentation

## 2013-04-29 DIAGNOSIS — F411 Generalized anxiety disorder: Secondary | ICD-10-CM | POA: Insufficient documentation

## 2013-04-29 DIAGNOSIS — E039 Hypothyroidism, unspecified: Secondary | ICD-10-CM | POA: Insufficient documentation

## 2013-04-29 DIAGNOSIS — N6325 Unspecified lump in the left breast, overlapping quadrants: Secondary | ICD-10-CM | POA: Insufficient documentation

## 2013-04-29 NOTE — Patient Instructions (Addendum)
1)  Anxiety - Stay on current dosage of Lexapro.   2)  Hypothyroidism - Continue on current dosage of Synthroid.   3)  Breast Lump - [facility].

## 2013-04-30 ENCOUNTER — Other Ambulatory Visit: Payer: Self-pay | Admitting: Family Medicine

## 2013-04-30 DIAGNOSIS — F410 Panic disorder [episodic paroxysmal anxiety] without agoraphobia: Secondary | ICD-10-CM

## 2013-04-30 MED ORDER — CLONAZEPAM 1 MG PO TABS: ORAL_TABLET | ORAL | Status: DC

## 2013-05-02 ENCOUNTER — Ambulatory Visit
Admission: RE | Admit: 2013-05-02 | Discharge: 2013-05-02 | Disposition: A | Discharge status: Home / Self Care | Payer: BC Managed Care – PPO | Source: Ambulatory Visit | Attending: Family Medicine | Admitting: Family Medicine

## 2013-05-02 DIAGNOSIS — N6325 Unspecified lump in the left breast, overlapping quadrants: Secondary | ICD-10-CM

## 2013-05-05 ENCOUNTER — Other Ambulatory Visit: Payer: Self-pay | Admitting: Family Medicine

## 2013-05-05 DIAGNOSIS — D0512 Intraductal carcinoma in situ of left breast: Secondary | ICD-10-CM

## 2013-05-06 ENCOUNTER — Telehealth: Payer: Self-pay | Admitting: *Deleted

## 2013-05-06 DIAGNOSIS — C50212 Malignant neoplasm of upper-inner quadrant of left female breast: Secondary | ICD-10-CM | POA: Insufficient documentation

## 2013-05-06 NOTE — Telephone Encounter (Signed)
Confirmed [facility] for 05/21/13 at 0800.  Instructions and contact information given. 

## 2013-05-14 ENCOUNTER — Telehealth: Payer: Self-pay | Admitting: *Deleted

## 2013-05-14 ENCOUNTER — Other Ambulatory Visit: Payer: BC Managed Care – PPO

## 2013-05-14 NOTE — Telephone Encounter (Signed)
Called and spoke with patient after receiving a call that patient had cancelled her MRI.  She states she has a Network engineer that is an Best boy at [facility] and she is going to be seen there and she would also like to cancel her [facility] apt. For 05/21/13.  Informed her to contact us if we could do anything for her in the future.

## 2013-05-21 ENCOUNTER — Ambulatory Visit: Payer: BC Managed Care – PPO

## 2013-05-21 ENCOUNTER — Other Ambulatory Visit: Payer: BC Managed Care – PPO

## 2013-05-21 ENCOUNTER — Ambulatory Visit: Payer: BC Managed Care – PPO | Admitting: Oncology

## 2013-09-27 ENCOUNTER — Other Ambulatory Visit: Payer: Self-pay | Admitting: Family Medicine

## 2013-10-31 ENCOUNTER — Other Ambulatory Visit: Payer: Self-pay | Admitting: *Deleted

## 2013-10-31 ENCOUNTER — Encounter: Payer: Self-pay | Admitting: *Deleted

## 2013-10-31 DIAGNOSIS — F411 Generalized anxiety disorder: Secondary | ICD-10-CM

## 2013-10-31 MED ORDER — ESCITALOPRAM OXALATE 5 MG PO TABS: ORAL_TABLET | ORAL | Status: DC

## 2013-11-01 ENCOUNTER — Other Ambulatory Visit: Payer: Self-pay | Admitting: Family Medicine

## 2013-11-19 ENCOUNTER — Encounter: Payer: Self-pay | Admitting: Family Medicine

## 2013-11-19 ENCOUNTER — Ambulatory Visit (INDEPENDENT_AMBULATORY_CARE_PROVIDER_SITE_OTHER): Payer: BC Managed Care – PPO | Admitting: Family Medicine

## 2013-11-19 VITALS — BP 107/73 | HR 57 | Resp 16 | Wt 137.0 lb

## 2013-11-19 DIAGNOSIS — E039 Hypothyroidism, unspecified: Secondary | ICD-10-CM

## 2013-11-19 DIAGNOSIS — F411 Generalized anxiety disorder: Secondary | ICD-10-CM

## 2013-11-19 LAB — T3, FREE: T3, Free: 3.2 pg/mL (ref 2.3–4.2)

## 2013-11-19 LAB — T4, FREE: Free T4: 1.04 ng/dL (ref 0.80–1.80)

## 2013-11-19 LAB — TSH: TSH: 5.128 u[IU]/mL — ABNORMAL HIGH (ref 0.350–4.500)

## 2013-11-19 MED ORDER — ESCITALOPRAM OXALATE 5 MG PO TABS: 5.0000 mg | ORAL_TABLET | Freq: Every day | ORAL | Status: DC

## 2013-11-19 NOTE — Progress Notes (Signed)
Subjective:    Patient ID: Carolyn Howell, female    DOB: 12-26-1980, 33 y.o.   MRN: [id number]  HPI  Carolyn Howell is here today needing to get a refill on her Lexapro.   She has  just finished with treatments at [facility] for her breast cancer. She feels that the Lexapro has really helped her and she would like to stay on it.    Review of Systems  Constitutional: Positive for unexpected weight change. Negative for activity change, appetite change and fatigue.  Cardiovascular: Negative for chest pain, palpitations and leg swelling.  Psychiatric/Behavioral: Negative for behavioral problems. The patient is not nervous/anxious.   All other systems reviewed and are negative.    Past Medical History  Diagnosis Date  . Colitis   . Hypothyroidism   . 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 Mastectomy      Modified Radical   . Breast reconstruction       History   Social History Narrative   Marital Status:  Married Carolyn Howell)    Children:  G2 Son and Daughter Carolyn Howell and Carolyn Howell)    Pets: Dog (1)    Living Situation: Lives with husband and children    Occupation:  Homemaker    Education:  Dietitian in Social Work    Tobacco Use/Exposure:  None    Alcohol Use:  Occasional   Drug Use:  None   Diet:  Glutten Free (trying to adapt to this diet)    Exercise:  Running, Biking, Swimming, Cross-fit, Body pump   Hobbies:  Working Out                  Family History  Problem Relation Age of Onset  . Diabetes Maternal Grandmother   . Breast cancer Paternal Grandmother   . Cancer Paternal Grandmother     ovarian  . Hypertension Mother   . Hyperlipidemia Father   . Birth defects Paternal Uncle     colon cancer     Current Outpatient Prescriptions on File Prior to Visit  Medication Sig Dispense Refill  . levothyroxine (SYNTHROID, LEVOTHROID) 100 MCG  tablet Take 1 tablet (100 mcg total) by mouth daily before breakfast. Patient prefers brand name  30 tablet  11   No current facility-administered medications on file prior to visit.     No Known Allergies   Immunization History  Administered Date(s) Administered  . Influenza,inj,Quad PF,36+ Mos 02/03/2013       Objective:   Physical Exam  Nursing note and vitals reviewed. Constitutional: She is oriented to person, place, and time. She appears well-nourished.  HENT:  Head: Normocephalic.  Pulmonary/Chest: Effort normal.  Musculoskeletal: Normal range of motion.  Neurological: She is alert and oriented to person, place, and time.  Skin: Skin is warm and dry.  Psychiatric: She has a normal mood and affect. Her behavior is normal. Judgment and thought content normal.      Assessment & Plan:   Carolyn Howell was seen today for medication management.  Diagnoses and associated orders for this visit:  Anxiety state, unspecified Comments: She has done well on Lexapro so she will remain on it.  She was given refills for 6 months.   - escitalopram (LEXAPRO) 5 MG tablet; Take 1 tablet (5 mg total) by mouth daily.  Unspecified hypothyroidism Comments: Carolyn Howell has been feeling  tired and has had trouble losing weight.  We checked her thyroid levels and her TSH is elevated.  We're going to try increasing her levothyroxine level to 125 mcg to see if this makes her feel better. We'll recheck her levels in 3-4 months.    - TSH - T4, free - T3, free

## 2013-11-19 NOTE — Patient Instructions (Addendum)
1)  Mood - Stay on Lexapro 5 mg daily.  2)  Hypothyroidism - We'll check your level and change the dosage if needed.

## 2013-12-04 ENCOUNTER — Encounter: Payer: Self-pay | Admitting: Family Medicine

## 2013-12-15 ENCOUNTER — Encounter: Payer: Self-pay | Admitting: *Deleted

## 2014-06-25 ENCOUNTER — Encounter: Payer: Self-pay | Admitting: *Deleted

## 2014-06-25 ENCOUNTER — Emergency Department
Admission: EM | Admit: 2014-06-25 | Discharge: 2014-06-25 | Disposition: A | Discharge status: Home / Self Care | Payer: BLUE CROSS/BLUE SHIELD | Source: Home / Self Care | Attending: Emergency Medicine | Admitting: Emergency Medicine

## 2014-06-25 ENCOUNTER — Emergency Department (INDEPENDENT_AMBULATORY_CARE_PROVIDER_SITE_OTHER): Payer: BLUE CROSS/BLUE SHIELD

## 2014-06-25 DIAGNOSIS — R05 Cough: Secondary | ICD-10-CM

## 2014-06-25 DIAGNOSIS — J208 Acute bronchitis due to other specified organisms: Secondary | ICD-10-CM

## 2014-06-25 DIAGNOSIS — Z853 Personal history of malignant neoplasm of breast: Secondary | ICD-10-CM | POA: Diagnosis not present

## 2014-06-25 DIAGNOSIS — J209 Acute bronchitis, unspecified: Secondary | ICD-10-CM | POA: Diagnosis not present

## 2014-06-25 DIAGNOSIS — R059 Cough, unspecified: Secondary | ICD-10-CM

## 2014-06-25 MED ORDER — AZITHROMYCIN 250 MG PO TABS: 250.0000 mg | ORAL_TABLET | Freq: Every day | ORAL | Status: DC

## 2014-06-25 MED ORDER — BENZONATATE 100 MG PO CAPS: 100.0000 mg | ORAL_CAPSULE | Freq: Three times a day (TID) | ORAL | Status: DC

## 2014-06-25 NOTE — ED Notes (Signed)
April c/o cough x 1 month. Denies fever, SOB or wheezing.

## 2014-06-25 NOTE — ED Provider Notes (Signed)
CSN: [id number]     Arrival date & time 06/25/14  0805 History   First MD Initiated Contact with Patient 06/25/14 [phone]     Chief Complaint  Patient presents with  . Cough   (Consider location/radiation/quality/duration/timing/severity/associated sxs/prior Treatment) Patient is a 34 y.o. female presenting with cough. The history is provided by the patient. No language interpreter was used.  Cough Cough characteristics:  Productive Sputum characteristics:  Nondescript Severity:  Moderate Onset quality:  Gradual Duration:  4 weeks Timing:  Constant Progression:  Worsening Chronicity:  New Smoker: no   Context: not sick contacts   Relieved by:  Nothing Worsened by:  Nothing tried Ineffective treatments:  None tried Associated symptoms: no chest pain   Risk factors: no recent infection     Past Medical History  Diagnosis Date  . Colitis   . Hypothyroidism   . 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 Mastectomy      Modified Radical   . Breast reconstruction     Family History  Problem Relation Age of Onset  . Diabetes Maternal Grandmother   . Breast cancer Paternal Grandmother   . Cancer Paternal Grandmother     ovarian  . Hypertension Mother   . Hyperlipidemia Father   . Birth defects Paternal Uncle     colon cancer   History  Substance Use Topics  . Smoking status: Never Smoker   . Smokeless tobacco: Never Used  . Alcohol Use: No   OB History    No data available     Review of Systems  Respiratory: Positive for cough.   Cardiovascular: Negative for chest pain.  All other systems reviewed and are negative.   Allergies  Review of patient's allergies indicates no known allergies.  Home Medications   Prior to Admission medications   Medication Sig Start Date End Date Taking? Authorizing Provider  escitalopram (LEXAPRO) 5 MG  tablet Take 1 tablet (5 mg total) by mouth daily. 11/19/13 11/20/14 Yes Jonathon Resides, MD  levothyroxine (SYNTHROID, LEVOTHROID) 112 MCG tablet Take 112 mcg by mouth daily before breakfast.   Yes Historical Provider, MD  tamoxifen (NOLVADEX) 20 MG tablet  11/11/13  Yes Historical Provider, MD  diazepam (VALIUM) 5 MG tablet Take 5 mg by mouth every 6 (six) hours as needed for anxiety.    Historical Provider, MD   BP 119/72 mmHg  Pulse 81  Temp(Src) 97.8 F (36.6 C) (Oral)  Resp 14  Ht 5\' 3"  (1.6 m)  Wt 141 lb (63.957 kg)  BMI 24.98 kg/m2  SpO2 100%  LMP 06/25/2014 Physical Exam  Constitutional: She appears well-developed and well-nourished.  HENT:  Head: Normocephalic and atraumatic.  Right Ear: External ear normal.  Eyes: Conjunctivae are normal. Pupils are equal, round, and reactive to light.  Neck: Normal range of motion. Neck supple.  Cardiovascular: Normal rate and normal heart sounds.   Pulmonary/Chest: Effort normal.  Abdominal: Soft.  Musculoskeletal: Normal range of motion.  Neurological: She is alert.  Skin: Skin is warm.  Psychiatric: She has a normal mood and affect.  Nursing note and vitals reviewed.   ED Course  Procedures (including critical care time) Labs Review Labs Reviewed - No data to display  Imaging Review Dg Chest 2 View  06/25/2014   CLINICAL DATA:  Cough for 1 month.  History of breast carcinoma  EXAM: CHEST  2 VIEW  COMPARISON:  None.  FINDINGS: Lungs are clear. Heart size and pulmonary vascularity are normal. No adenopathy. No pneumothorax. No bone lesions.  IMPRESSION: No edema or consolidation.   Electronically Signed   By: Lowella Grip III M.D.   On: 06/25/2014 09:25     MDM   1. Acute bronchitis due to other specified organisms   2. Cough   3. Cough    zithromax Tessalon AVS See your Physician for recheck in 1 week    Fransico Meadow, PA-C 06/25/14 [id number]

## 2014-06-25 NOTE — Discharge Instructions (Signed)

## 2015-04-25 ENCOUNTER — Emergency Department (INDEPENDENT_AMBULATORY_CARE_PROVIDER_SITE_OTHER)
Admission: EM | Admit: 2015-04-25 | Discharge: 2015-04-25 | Disposition: A | Discharge status: Home / Self Care | Payer: BLUE CROSS/BLUE SHIELD | Source: Home / Self Care | Attending: Family Medicine | Admitting: Family Medicine

## 2015-04-25 ENCOUNTER — Encounter: Payer: Self-pay | Admitting: Emergency Medicine

## 2015-04-25 ENCOUNTER — Emergency Department (INDEPENDENT_AMBULATORY_CARE_PROVIDER_SITE_OTHER): Payer: BLUE CROSS/BLUE SHIELD

## 2015-04-25 DIAGNOSIS — R079 Chest pain, unspecified: Secondary | ICD-10-CM

## 2015-04-25 DIAGNOSIS — R05 Cough: Secondary | ICD-10-CM | POA: Diagnosis not present

## 2015-04-25 DIAGNOSIS — J209 Acute bronchitis, unspecified: Secondary | ICD-10-CM | POA: Diagnosis not present

## 2015-04-25 MED ORDER — AZITHROMYCIN 250 MG PO TABS: ORAL_TABLET | ORAL | Status: DC

## 2015-04-25 MED ORDER — BENZONATATE 200 MG PO CAPS: 200.0000 mg | ORAL_CAPSULE | Freq: Every day | ORAL | Status: DC

## 2015-04-25 MED ORDER — PREDNISONE 20 MG PO TABS: 20.0000 mg | ORAL_TABLET | Freq: Two times a day (BID) | ORAL | Status: DC

## 2015-04-25 NOTE — ED Notes (Signed)
Pt c/o non productive cough for 2 1/2 weeks, chest congestion

## 2015-04-25 NOTE — ED Provider Notes (Signed)
CSN: [id number]     Arrival date & time 04/25/15  1257 History   First MD Initiated Contact with Patient 04/25/15 1421     Chief Complaint  Patient presents with  . Cough      HPI Comments: 2.5 weeks ago patient developed typical cold-like symptoms including mild sore throat, sinus congestion, headache, fatigue, fever/sweats, and cough.  She was evaluated initially and rapid strep and flu tests were negative.  Her cough has persisted; she often coughs until she gags.  The history is provided by the patient.    Past Medical History  Diagnosis Date  . Colitis   . Hypothyroidism   . 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 Mastectomy      Modified Radical   . Breast reconstruction     Family History  Problem Relation Age of Onset  . Diabetes Maternal Grandmother   . Breast cancer Paternal Grandmother   . Cancer Paternal Grandmother     ovarian  . Hypertension Mother   . Hyperlipidemia Father   . Birth defects Paternal Uncle     colon cancer   Social History  Substance Use Topics  . Smoking status: Never Smoker   . Smokeless tobacco: Never Used  . Alcohol Use: No   OB History    No data available     Review of Systems + sore throat + cough No pleuritic pain, but has tightness in anterior chest No wheezing + nasal congestion + post-nasal drainage No sinus pain/pressure No itchy/red eyes No earache No hemoptysis No SOB + fever, + chills, resolved + nausea, resolved + vomiting, resolved No abdominal pain No diarrhea No urinary symptoms No skin rash + fatigue No myalgias No headache Used OTC meds without relief  Allergies  Review of patient's allergies indicates no known allergies.  Home Medications   Prior to Admission medications   Medication Sig Start Date End Date Taking? Authorizing Provider  Probiotic Product  (PROBIOTIC DAILY PO) Take by mouth.   Yes Historical Provider, MD  azithromycin (ZITHROMAX Z-PAK) 250 MG tablet Take 2 tabs today; then begin one tab once daily for 4 more days. 04/25/15   Kandra Nicolas, MD  benzonatate (TESSALON) 200 MG capsule Take 1 capsule (200 mg total) by mouth at bedtime. Take as needed for cough 04/25/15   Kandra Nicolas, MD  escitalopram (LEXAPRO) 5 MG tablet Take 1 tablet (5 mg total) by mouth daily. 11/19/13 11/20/14  Jonathon Resides, MD  levothyroxine (SYNTHROID, LEVOTHROID) 112 MCG tablet Take 112 mcg by mouth daily before breakfast.    Historical Provider, MD  predniSONE (DELTASONE) 20 MG tablet Take 1 tablet (20 mg total) by mouth 2 (two) times daily. Take with food. 04/25/15   Kandra Nicolas, MD  tamoxifen (NOLVADEX) 20 MG tablet  11/11/13   Historical Provider, MD   Meds Ordered and Administered this Visit  Medications - No data to display  BP 115/74 mmHg  Pulse 66  Temp(Src) 97.9 F (36.6 C) (Oral)  Ht 5\' 3"  (1.6 m)  Wt 135 lb 8 oz (61.462 kg)  BMI 24.01 kg/m2  SpO2 100%  LMP 04/14/2015 No data found.   Physical Exam Nursing notes and Vital Signs reviewed. Appearance:  Patient appears stated age, and in no acute distress Eyes:  Pupils are equal, round, and reactive to light  and accomodation.  Extraocular movement is intact.  Conjunctivae are not inflamed  Ears:  Canals normal.  Tympanic membranes normal.  Nose:  Mildly congested turbinates.  No sinus tenderness.    Pharynx:  Normal Neck:  Supple.  No adenopathy Lungs:  Clear to auscultation.  Breath sounds are equal.  Moving air well. Heart:  Regular rate and rhythm without murmurs, rubs, or gallops.  Abdomen:  Nontender without masses or hepatosplenomegaly.  Bowel sounds are present.  No CVA or flank tenderness.  Extremities:  No edema.   Skin:  No rash present.   ED Course  Procedures  None   Imaging Review Dg Chest 2 View  04/25/2015  CLINICAL DATA:  Nonproductive cough for 2 weeks, chest  pain EXAM: CHEST - 2 VIEW COMPARISON:  None. FINDINGS: The heart size and mediastinal contours are within normal limits. Both lungs are clear. The visualized skeletal structures are unremarkable. Breast implants are noted. IMPRESSION: No active disease. Electronically Signed   By: Inez Catalina M.D.   On: 04/25/2015 15:06     MDM   1. Acute bronchitis, unspecified organism    Begin Z-pak for atypical coverage  (patient has taken in past without adverse effect) Prescription written for Benzonatate (Tessalon) to take at bedtime for night-time cough.  Begin prednisone burst. Take plain guaifenesin (1200mg  extended release tabs such as Mucinex) twice daily, with plenty of water, for cough and congestion.  May add Pseudoephedrine (30mg , one or two every 4 to 6 hours) for sinus congestion.  Get adequate rest.   Try warm salt water gargles for sore throat.  Stop all antihistamines for now, and other non-prescription cough/cold preparations. Follow-up with family doctor if not improving about 7 to10 days.     Kandra Nicolas, MD 04/30/15 [phone]

## 2015-04-25 NOTE — Discharge Instructions (Signed)
Take plain guaifenesin (1200mg  extended release tabs such as Mucinex) twice daily, with plenty of water, for cough and congestion.  May add Pseudoephedrine (30mg , one or two every 4 to 6 hours) for sinus congestion.  Get adequate rest.   Try warm salt water gargles for sore throat.  Stop all antihistamines for now, and other non-prescription cough/cold preparations. Follow-up with family doctor if not improving about 7 to10 days.

## 2016-02-21 ENCOUNTER — Emergency Department (INDEPENDENT_AMBULATORY_CARE_PROVIDER_SITE_OTHER)
Admission: EM | Admit: 2016-02-21 | Discharge: 2016-02-21 | Disposition: A | Discharge status: Home / Self Care | Payer: 59 | Source: Home / Self Care | Attending: Family Medicine | Admitting: Family Medicine

## 2016-02-21 ENCOUNTER — Encounter: Payer: Self-pay | Admitting: *Deleted

## 2016-02-21 DIAGNOSIS — L03213 Periorbital cellulitis: Secondary | ICD-10-CM | POA: Diagnosis not present

## 2016-02-21 MED ORDER — CLINDAMYCIN HCL 300 MG PO CAPS: 300.0000 mg | ORAL_CAPSULE | Freq: Three times a day (TID) | ORAL | 0 refills | Status: DC

## 2016-02-21 MED ORDER — TOBRAMYCIN 0.3 % OP SOLN: 1.0000 [drp] | OPHTHALMIC | 0 refills | Status: DC

## 2016-02-21 NOTE — ED Provider Notes (Signed)
[facility]    CSN: [id number] Arrival date & time: 02/21/16  0909     History   Chief Complaint Chief Complaint  Patient presents with  . Eye Problem    HPI Carolyn Howell is a 35 y.o. female.   Patient complains of onset two days ago of redness, itching, and drainage from her left eye.  She denies pain with eye movement, and no change in vision.  No foreign body sensation.  About one week ago she recovered from a URI with nasal congestion, sore throat, and cough.  She works at a preschool.    The history is provided by the patient.  Eye Problem  Location:  Left eye Quality:  Burning Severity:  Mild Onset quality:  Sudden Duration:  2 days Timing:  Constant Progression:  Worsening Chronicity:  New Context: contact lenses   Context: not burn, not chemical exposure, not direct trauma, not foreign body, not scratch, not smoke exposure and not UV exposure   Relieved by:  Closing eye Worsened by:  Nothing Ineffective treatments:  None tried Associated symptoms: crusting, discharge, inflammation, itching, redness, scotomas, swelling and tearing   Associated symptoms: no blurred vision, no decreased vision, no double vision, no facial rash, no headaches, no nausea, no numbness, no photophobia, no tingling, no vomiting and no weakness   Risk factors: recent URI     Past Medical History:  Diagnosis Date  . 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 Colitis   . Hypothyroidism     Patient Active Problem List   Diagnosis Date Noted  . 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 Anxiety state, unspecified 04/29/2013  . Unspecified hypothyroidism 04/29/2013  . Menorrhagia 04/29/2013  . Breast lump on left side at 12 o'clock position 04/29/2013  . IBS (irritable bowel syndrome) 04/15/2013  . Panic attack 04/15/2013  . Routine general medical  examination at a health care facility 04/13/2013  . Need for prophylactic vaccination and inoculation against influenza 04/13/2013  . Hypothyroidism 04/03/2011    Past Surgical History:  Procedure Laterality Date  . BREAST RECONSTRUCTION    . MASTECTOMY     Modified Radical     OB History    No data available       Home Medications    Prior to Admission medications   Medication Sig Start Date End Date Taking? Authorizing Provider  levothyroxine (SYNTHROID, LEVOTHROID) 112 MCG tablet Take 112 mcg by mouth daily before breakfast.   Yes Historical Provider, MD  Probiotic Product (PROBIOTIC DAILY PO) Take by mouth.   Yes Historical Provider, MD  tamoxifen (NOLVADEX) 20 MG tablet  11/11/13  Yes Historical Provider, MD  clindamycin (CLEOCIN) 300 MG capsule Take 1 capsule (300 mg total) by mouth 3 (three) times daily. 02/21/16   Kandra Nicolas, MD  tobramycin (TOBREX) 0.3 % ophthalmic solution Place 1 drop into the left eye every 4 (four) hours. (Use while awake) 02/21/16   Kandra Nicolas, MD    Family History Family History  Problem Relation Age of Onset  . Diabetes Maternal Grandmother   . Breast cancer Paternal Grandmother   . Cancer Paternal Grandmother     ovarian  . Hypertension Mother   . Hyperlipidemia Father   . Birth defects Paternal Uncle     colon cancer    Social History Social History  Substance Use Topics  .  Smoking status: Never Smoker  . Smokeless tobacco: Never Used  . Alcohol use No     Allergies   Review of patient's allergies indicates no known allergies.   Review of Systems Review of Systems  Eyes: Positive for discharge, redness and itching. Negative for blurred vision, double vision and photophobia.  Gastrointestinal: Negative for nausea and vomiting.  Neurological: Negative for tingling, weakness, numbness and headaches.  All other systems reviewed and are negative.    Physical Exam Triage Vital Signs ED Triage Vitals  Enc Vitals  Group     BP 02/21/16 0953 112/75     Pulse Rate 02/21/16 0953 67     Resp 02/21/16 0953 16     Temp 02/21/16 0953 98.1 F (36.7 C)     Temp Source 02/21/16 0953 Oral     SpO2 02/21/16 0953 100 %     Weight 02/21/16 0953 135 lb (61.2 kg)     Height 02/21/16 0953 5\' 2"  (1.575 m)     Head Circumference --      Peak Flow --      Pain Score 02/21/16 0955 0     Pain Loc --      Pain Edu? --      Excl. in [place]? --    No data found.   Updated Vital Signs BP 112/75 (BP Location: Left Arm)   Pulse 67   Temp 98.1 F (36.7 C) (Oral)   Resp 16   Ht 5\' 2"  (1.575 m)   Wt 135 lb (61.2 kg)   LMP 02/11/2016   SpO2 100%   BMI 24.69 kg/m   Visual Acuity Right Eye Distance: 20/30 Left Eye Distance: 20/25 Bilateral Distance: 20/25 (w/ glasses)  Right Eye Near:   Left Eye Near:    Bilateral Near:     Physical Exam  Constitutional: She appears well-developed and well-nourished. No distress.  HENT:  Head: Normocephalic.  Right Ear: Tympanic membrane, external ear and ear canal normal.  Left Ear: Tympanic membrane, external ear and ear canal normal.  Nose: Nose normal.  Mouth/Throat: Oropharynx is clear and moist.  Eyes: EOM are normal. Pupils are equal, round, and reactive to light. Lids are everted and swept, no foreign bodies found. Right eye exhibits no discharge, no exudate and no hordeolum. No foreign body present in the right eye. Left eye exhibits no discharge and no exudate. Left conjunctiva is injected.    Left upper and lower lids slightly swollen, erythematous, and tender to palpation.  Neck: Neck supple.  Pulmonary/Chest: Breath sounds normal.  Lymphadenopathy:    She has no cervical adenopathy.  Nursing note and vitals reviewed.    UC Treatments / Results  Labs (all labs ordered are listed, but only abnormal results are displayed) Labs Reviewed - No data to display  EKG  EKG Interpretation None       Radiology No results found.  Procedures Procedures  (including critical care time)  Medications Ordered in UC Medications - No data to display   Initial Impression / Assessment and Plan / UC Course  I have reviewed the triage vital signs and the nursing notes.  Pertinent labs & imaging results that were available during my care of the patient were reviewed by me and considered in my medical decision making (see chart for details).  Clinical Course  No evidence orbital cellulitis. Begin Clindamycin 300mg  TID for one week, and tobramycin opthalmic suspension. Apply warm compress to left eye 2 or 3 times daily.  May take Ibuprofen or Tylenol as needed for pain. If symptoms become significantly worse during the night or over the weekend, proceed to the local emergency room.  Followup with ophthalmologist if not improved 3 days.     Final Clinical Impressions(s) / UC Diagnoses   Final diagnoses:  Preseptal cellulitis of left eye    New Prescriptions New Prescriptions   CLINDAMYCIN (CLEOCIN) 300 MG CAPSULE    Take 1 capsule (300 mg total) by mouth 3 (three) times daily.   TOBRAMYCIN (TOBREX) 0.3 % OPHTHALMIC SOLUTION    Place 1 drop into the left eye every 4 (four) hours. (Use while awake)     Kandra Nicolas, MD 03/05/16 [phone]

## 2016-02-21 NOTE — Discharge Instructions (Signed)
Apply warm compress to left eye 2 or 3 times daily.  May take Ibuprofen or Tylenol as needed for pain. If symptoms become significantly worse during the night or over the weekend, proceed to the local emergency room.

## 2016-02-21 NOTE — ED Triage Notes (Signed)
Pt c/o LT eye redness, itching, and drainage x 2 days. Reports URI last week. Works at a preschool.

## 2020-03-25 ENCOUNTER — Emergency Department (HOSPITAL_BASED_OUTPATIENT_CLINIC_OR_DEPARTMENT_OTHER)
Admission: EM | Admit: 2020-03-25 | Discharge: 2020-03-25 | Disposition: A | Discharge status: Home / Self Care | Payer: 59 | Attending: Emergency Medicine | Admitting: Emergency Medicine

## 2020-03-25 ENCOUNTER — Other Ambulatory Visit: Payer: Self-pay

## 2020-03-25 ENCOUNTER — Encounter (HOSPITAL_BASED_OUTPATIENT_CLINIC_OR_DEPARTMENT_OTHER): Payer: Self-pay | Admitting: *Deleted

## 2020-03-25 DIAGNOSIS — Z79899 Other long term (current) drug therapy: Secondary | ICD-10-CM | POA: Diagnosis not present

## 2020-03-25 DIAGNOSIS — M79601 Pain in right arm: Secondary | ICD-10-CM | POA: Diagnosis not present

## 2020-03-25 DIAGNOSIS — E039 Hypothyroidism, unspecified: Secondary | ICD-10-CM | POA: Diagnosis not present

## 2020-03-25 DIAGNOSIS — M79602 Pain in left arm: Secondary | ICD-10-CM | POA: Diagnosis present

## 2020-03-25 DIAGNOSIS — Z853 Personal history of malignant neoplasm of breast: Secondary | ICD-10-CM | POA: Diagnosis not present

## 2020-03-25 MED ORDER — OXYCODONE-ACETAMINOPHEN 5-325 MG PO TABS
1.0000 | ORAL_TABLET | Freq: Once | ORAL | Status: AC
  Administered 2020-03-25: 1 via ORAL
  Filled 2020-03-25: qty 1

## 2020-03-25 MED ORDER — KETOROLAC TROMETHAMINE 30 MG/ML IJ SOLN
30.0000 mg | Freq: Once | INTRAMUSCULAR | Status: AC
  Administered 2020-03-25: 30 mg via INTRAVENOUS
  Filled 2020-03-25: qty 1

## 2020-03-25 MED ORDER — OXYCODONE-ACETAMINOPHEN 5-325 MG PO TABS: 1.0000 | ORAL_TABLET | ORAL | 0 refills | Status: AC | PRN

## 2020-03-25 NOTE — ED Triage Notes (Signed)
Left arm pain into her elbow since May. She is a chemo patient. She has breast cancer with mets.

## 2020-03-25 NOTE — ED Provider Notes (Signed)
[facility] Provider Note   CSN: [id number] Arrival date & time: 03/25/20  1845     History Chief Complaint  Patient presents with  . Arm Pain    Carolyn Howell is a 39 y.o. female.  HPI   Patient with significant medical history of metastatic left breast cancer, currently on oral radiation presents to the emergency department with chief complaint of left arm pain.  Patient states she has been having left arm pain since May.  She explains that she feels electric-like sensation coming from her left shoulder rating down the back of her arm and into her fingers.  She endorses numbness and tingling in her left hand, difficulty with opening closing her hands as well.  She has had MRI and nerve testing which was inconclusive.  She has been seen by her oncologist as well as orthopedic doctors who have been unable to determine the cause of her pain.  They suspect this maybe caused by her metastatic cancer.  She was recently started on a Solu-Medrol Dosepak had currently finished that today.  Patient states her hand movement had improved but since finishing her Dosepak she is in excruciating pain which prompted her to come in.  She denies any recent trauma to the area, denies chest pain, shortness of breath, becoming diaphoretic, paresthesias or weakness in her other extremities.  She states she has an appointment with her radiation oncologist tomorrow.  Patient denies any alleviating factors at this time.  Patient denies headaches, fevers, chills, shortness of breath, chest pain, abdominal pain, nausea, vomiting, diarrhea, pedal edema.  Past Medical History:  Diagnosis Date  . 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 Colitis   . Hypothyroidism     Patient Active Problem List   Diagnosis Date Noted  . Breast cancer of upper-inner quadrant of left female breast ([place]) 05/06/2013  . Anxiety  state, unspecified 04/29/2013  . Unspecified hypothyroidism 04/29/2013  . Menorrhagia 04/29/2013  . Breast lump on left side at 12 o'clock position 04/29/2013  . IBS (irritable bowel syndrome) 04/15/2013  . Panic attack 04/15/2013  . Routine general medical examination at a health care facility 04/13/2013  . Need for prophylactic vaccination and inoculation against influenza 04/13/2013  . Hypothyroidism 04/03/2011    Past Surgical History:  Procedure Laterality Date  . BREAST RECONSTRUCTION    . MASTECTOMY     Modified Radical      OB History   No obstetric history on file.     Family History  Problem Relation Age of Onset  . Diabetes Maternal Grandmother   . Breast cancer Paternal Grandmother   . Cancer Paternal Grandmother        ovarian  . Hypertension Mother   . Hyperlipidemia Father   . Birth defects Paternal Uncle        colon cancer    Social History   Tobacco Use  . Smoking status: Never Smoker  . Smokeless tobacco: Never Used  Substance Use Topics  . Alcohol use: No  . Drug use: No    Home Medications Prior to Admission medications   Medication Sig Start Date End Date Taking? Authorizing Provider  Amniotic Membrane Allograft (AFFINITY EX) Apply topically.   Yes [provider]  FULVESTRANT IM Inject into the muscle.   Yes [provider]  levothyroxine (SYNTHROID, LEVOTHROID) 112 MCG tablet Take 112 mcg by mouth daily before  breakfast.   Yes [provider]  Probiotic Product (PROBIOTIC DAILY PO) Take by mouth.   Yes [provider]  clindamycin (CLEOCIN) 300 MG capsule Take 1 capsule (300 mg total) by mouth 3 (three) times daily. 02/21/16   Kandra Nicolas, MD  oxyCODONE-acetaminophen (PERCOCET/ROXICET) 5-325 MG tablet Take 1-2 tablets by mouth every 4 (four) hours as needed for up to 3 days for severe pain. 03/25/20 03/28/20  Marcello Fennel, PA-C  tamoxifen (NOLVADEX) 20 MG tablet  11/11/13   [provider]  tobramycin (TOBREX) 0.3 % ophthalmic solution Place 1 drop into the left eye every 4 (four) hours. (Use while awake) 02/21/16   Kandra Nicolas, MD    Allergies    Patient has no known allergies.  Review of Systems   Review of Systems  Constitutional: Negative for chills and fever.  HENT: Negative for congestion and sore throat.   Eyes: Negative for visual disturbance.  Respiratory: Negative for shortness of breath.   Cardiovascular: Negative for chest pain.  Gastrointestinal: Negative for abdominal pain, diarrhea, nausea and vomiting.  Genitourinary: Negative for enuresis.  Musculoskeletal: Negative for back pain.        Left arm pain and hand numbness.  Skin: Negative for rash.  Neurological: Negative for dizziness and headaches.  Hematological: Does not bruise/bleed easily.    Physical Exam Updated Vital Signs BP 128/74   Pulse 75   Temp 98 F (36.7 C)   Resp 18   Ht 5\' 2"  (1.575 m)   Wt 61.7 kg   SpO2 100%   BMI 24.87 kg/m   Physical Exam Vitals and nursing note reviewed.  Constitutional:      General: She is not in acute distress.    Appearance: She is not ill-appearing.  HENT:     Head: Normocephalic and atraumatic.     Nose: No congestion.  Eyes:     Conjunctiva/sclera: Conjunctivae normal.  Cardiovascular:     Rate and Rhythm: Normal rate and regular rhythm.     Pulses: Normal pulses.     Heart sounds: No murmur heard.  No friction rub. No gallop.   Pulmonary:     Effort: No respiratory distress.     Breath sounds: No wheezing, rhonchi or rales.  Abdominal:     Palpations: Abdomen is soft.     Tenderness: There is no abdominal tenderness.  Musculoskeletal:        General: Tenderness present.     Cervical back: Normal range of motion. No rigidity or tenderness.     Comments: Patient's left arm was visualized no edema, erythema, lacerations, abrasions ecchymosis or other gross abnormalities noted.  Patient had full range of motion at  her shoulder, elbow, wrist but had decreased flexion and extension at her fourth and fifth digits.  She also noted decreased strength in her shoulder, elbow, and left grip strength 4/5.  there was noted point tenderness along her left scapula but with no deformities or crepitus palpated.  She had good radial pulses, good capillary refill.   Skin:    General: Skin is warm and dry.     Capillary Refill: Capillary refill takes less than 2 seconds.     Findings: No lesion or rash.  Neurological:     Mental Status: She is alert.  Psychiatric:        Mood and Affect: Mood normal.     ED Results / Procedures / Treatments   Labs (all labs ordered are  listed, but only abnormal results are displayed) Labs Reviewed - No data to display  EKG None  Radiology No results found.  Procedures Procedures (including critical care time)  Medications Ordered in ED Medications  ketorolac (TORADOL) 30 MG/ML injection 30 mg (30 mg Intravenous Given 03/25/20 2053)  oxyCODONE-acetaminophen (PERCOCET/ROXICET) 5-325 MG per tablet 1 tablet (1 tablet Oral Given 03/25/20 2129)    ED Course  I have reviewed the triage vital signs and the nursing notes.  Pertinent labs & imaging results that were available during my care of the patient were reviewed by me and considered in my medical decision making (see chart for details).    MDM Rules/Calculators/A&P                          Patient presents with left arm and finger numbness.  She is alert, did not appear in acute distress, vital signs reassuring.  Due to well-appearing patient, benign physical exam further lab or imaging not warranted at this time.  I have low suspicion for septic arthritis as patient denies IV drug use, skin exam was performed no erythematous, edematous, warm joints noted on exam, no new heart murmur heard on exam.  Low suspicion for fracture or dislocation as patient denies any recent traumas to her left shoulder or arm, no gross  deformities felt upon palpation.  Low suspicion for compartment syndrome as area was palpated it was soft to the touch, neurovascular fully intact.  Will defer further imaging at this time as patient has had PET scan on 11/02 showing disease progression characterized by new hypermetabolic subcarinal nodes hepatic foci a new ostial lesion in the right smoker lesion.  After reviewing patient's notes patient's weakness has been well-documented and appears to be at baseline currently.  will defer starting patient on steroids as patient just finished a steroid Dosepak, and she is being closely followed by her oncologist do not want to interrupt treatment plan.  Will provide patient with Toradol in the emergency department and give her 3 days of narcotics.  Will recommend that she follows up with her radiologist oncologist tomorrow for further evaluation.  Vital signs have remained stable, no indication for hospital admission.  Patient discussed with attending and they agreed with assessment and plan.  Patient given at home care as well strict return precautions.  Patient verbalized that they understood agreed to said plan.   Final Clinical Impression(s) / ED Diagnoses Final diagnoses:  Right arm pain    Rx / DC Orders ED Discharge Orders         Ordered    oxyCODONE-acetaminophen (PERCOCET/ROXICET) 5-325 MG tablet  Every 4 hours PRN        03/25/20 2136           Marcello Fennel, PA-C 03/25/20 2142    Deno Etienne, DO 03/25/20 2224

## 2020-03-25 NOTE — Discharge Instructions (Signed)
Seen here for arm pain.  Started you on narcotics for severe pain.  Please be aware this medication can make you sleepy do not consume alcohol while taking this medication.  Please do not operate heavy machinery while on this medication.  This medication contains Tylenol, do not take Tylenol with this medication.  You may take ibuprofen every 6 as needed please follow dosing the back of bottle.  I highly recommend that you follow-up with your oncologist tomorrow for further evaluation management.  Come back to the emergency department if you develop chest pain, shortness of breath, severe abdominal pain, uncontrolled nausea, vomiting, diarrhea.

## 2021-09-12 ENCOUNTER — Other Ambulatory Visit: Payer: Self-pay

## 2021-09-12 ENCOUNTER — Emergency Department (HOSPITAL_BASED_OUTPATIENT_CLINIC_OR_DEPARTMENT_OTHER)
Admission: EM | Admit: 2021-09-12 | Discharge: 2021-09-13 | Disposition: A | Discharge status: Home / Self Care | Payer: 59 | Attending: Emergency Medicine | Admitting: Emergency Medicine

## 2021-09-12 ENCOUNTER — Encounter (HOSPITAL_BASED_OUTPATIENT_CLINIC_OR_DEPARTMENT_OTHER): Payer: Self-pay

## 2021-09-12 ENCOUNTER — Emergency Department (HOSPITAL_BASED_OUTPATIENT_CLINIC_OR_DEPARTMENT_OTHER): Payer: 59

## 2021-09-12 DIAGNOSIS — Z853 Personal history of malignant neoplasm of breast: Secondary | ICD-10-CM | POA: Diagnosis not present

## 2021-09-12 DIAGNOSIS — C50919 Malignant neoplasm of unspecified site of unspecified female breast: Secondary | ICD-10-CM

## 2021-09-12 DIAGNOSIS — R0781 Pleurodynia: Secondary | ICD-10-CM | POA: Diagnosis not present

## 2021-09-12 LAB — CBC WITH DIFFERENTIAL/PLATELET
Abs Immature Granulocytes: 0.02 10*3/uL (ref 0.00–0.07)
Basophils Absolute: 0.1 10*3/uL (ref 0.0–0.1)
Basophils Relative: 1 %
Eosinophils Absolute: 0.3 10*3/uL (ref 0.0–0.5)
Eosinophils Relative: 6 %
HCT: 40.2 % (ref 36.0–46.0)
Hemoglobin: 13.6 g/dL (ref 12.0–15.0)
Immature Granulocytes: 0 %
Lymphocytes Relative: 15 %
Lymphs Abs: 0.8 10*3/uL (ref 0.7–4.0)
MCH: 30.4 pg (ref 26.0–34.0)
MCHC: 33.8 g/dL (ref 30.0–36.0)
MCV: 89.9 fL (ref 80.0–100.0)
Monocytes Absolute: 0.8 10*3/uL (ref 0.1–1.0)
Monocytes Relative: 15 %
Neutro Abs: 3.4 10*3/uL (ref 1.7–7.7)
Neutrophils Relative %: 63 %
Platelets: 266 10*3/uL (ref 150–400)
RBC: 4.47 MIL/uL (ref 3.87–5.11)
RDW: 13.2 % (ref 11.5–15.5)
WBC: 5.4 10*3/uL (ref 4.0–10.5)
nRBC: 0 % (ref 0.0–0.2)

## 2021-09-12 LAB — BASIC METABOLIC PANEL
Anion gap: 6 (ref 5–15)
BUN: 12 mg/dL (ref 6–20)
CO2: 28 mmol/L (ref 22–32)
Calcium: 9.6 mg/dL (ref 8.9–10.3)
Chloride: 104 mmol/L (ref 98–111)
Creatinine, Ser: 0.62 mg/dL (ref 0.44–1.00)
GFR, Estimated: >60 mL/min (ref >60)
Glucose, Bld: 92 mg/dL (ref 70–99)
Potassium: 3.6 mmol/L (ref 3.5–5.1)
Sodium: 138 mmol/L (ref 135–145)

## 2021-09-12 LAB — D-DIMER, QUANTITATIVE: D-Dimer, Quant: 1.49 ug/mL-FEU — ABNORMAL HIGH (ref 0.00–0.50)

## 2021-09-12 MED ORDER — HYDROCODONE-ACETAMINOPHEN 5-325 MG PO TABS
1.0000 | ORAL_TABLET | Freq: Once | ORAL | Status: AC
  Administered 2021-09-12: 1 via ORAL
  Filled 2021-09-12: qty 1

## 2021-09-12 MED ORDER — OXYCODONE-ACETAMINOPHEN 5-325 MG PO TABS: 1.0000 | ORAL_TABLET | Freq: Four times a day (QID) | ORAL | 0 refills | Status: DC | PRN

## 2021-09-12 MED ORDER — IOHEXOL 350 MG/ML SOLN
75.0000 mL | Freq: Once | INTRAVENOUS | Status: AC | PRN
  Administered 2021-09-12: 75 mL via INTRAVENOUS

## 2021-09-12 NOTE — ED Notes (Signed)
Pt awake, alert and oriented x4.  RR even and unlabored on RA however pt reports worsening L lateral rib region pain rated 7/10 with rest and exacerbated with deep inspiration.  Abdomen soft nontender.  Pt denies cp, n/v/d, urinary complaints and reports regular stools (every other day is baseline for pt).  Pt denies any immediate needs, questions, concerns.  Cardiac and pulse ox monitoring maintained.  Will monitor for acute changes and maintain plan of care  ?

## 2021-09-12 NOTE — ED Provider Notes (Signed)
?[facility] ?Provider Note ? ? ?CSN: [id number] ?Arrival date & time: 09/12/21  1924 ? ?  ? ?History ? ?Chief Complaint  ?Patient presents with  ? Rib Cage Pain  ? ? ?Carolyn Howell is a 41 y.o. female. ? ?Patient with metastatic breast cancer and chemotherapy.  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  She complains of left-sided rib pain over the past 4 days that is sharp, stabbing and worse with movement, breathing.  She has been using ibuprofen, heat, ice and Biofreeze without relief.  Pain is worse with movement and deep breathing.  No cough or fever.  No abdominal pain.  No history of blood clots.  No leg pain or leg swelling.  Has not had this pain in the past ? ?The history is provided by the patient and a relative.  ? ?  ? ?Home Medications ?Prior to Admission medications   ?Medication Sig Start Date End Date Taking? Authorizing Provider  ?Amniotic Membrane Allograft (AFFINITY EX) Apply topically.    [provider]  ?clindamycin (CLEOCIN) 300 MG capsule Take 1 capsule (300 mg total) by mouth 3 (three) times daily. 02/21/16   Kandra Nicolas, MD  ?FULVESTRANT IM Inject into the muscle.    [provider]  ?levothyroxine (SYNTHROID, LEVOTHROID) 112 MCG tablet Take 112 mcg by mouth daily before breakfast.    [provider]  ?Probiotic Product (PROBIOTIC DAILY PO) Take by mouth.    [provider]  ?tamoxifen (NOLVADEX) 20 MG tablet  11/11/13   [provider]  ?tobramycin (TOBREX) 0.3 % ophthalmic solution Place 1 drop into the left eye every 4 (four) hours. (Use while awake) 02/21/16   Kandra Nicolas, MD  ?   ? ?Allergies    ?Patient has no known allergies.   ? ?Review of Systems   ?Review of Systems  ?Constitutional:  Negative for activity change, appetite change and fever.  ?HENT:  Negative for congestion and rhinorrhea.   ?Respiratory:  Positive for chest tightness. Negative for shortness of breath.    ?Cardiovascular:  Negative for chest pain.  ?Gastrointestinal:  Negative for abdominal pain, nausea and vomiting.  ?Genitourinary:  Negative for dysuria and hematuria.  ?Musculoskeletal:  Positive for arthralgias and myalgias.  ?Skin:  Negative for rash.  ?Neurological:  Negative for dizziness, weakness and headaches.  ? all other systems are negative except as noted in the HPI and PMH.  ? ?Physical Exam ?Updated Vital Signs ?BP 124/81   Pulse 97   Temp 98.6 ?F (37 ?C) (Oral)   Ht '5\' 2"'$  (1.575 m)   Wt 63.5 kg   BMI 25.61 kg/m?  ?Physical Exam ?Vitals and nursing note reviewed.  ?Constitutional:   ?   General: She is not in acute distress. ?   Appearance: She is well-developed.  ?   Comments: uncomfortable  ?HENT:  ?   Head: Normocephalic and atraumatic.  ?   Mouth/Throat:  ?   Pharynx: No oropharyngeal exudate.  ?Eyes:  ?   Conjunctiva/sclera: Conjunctivae normal.  ?   Pupils: Pupils are equal, round, and reactive to light.  ?Neck:  ?   Comments: No meningismus. ?Cardiovascular:  ?   Rate and Rhythm: Normal rate and regular rhythm.  ?   Heart sounds: Normal heart sounds. No murmur heard. ?Pulmonary:  ?   Effort: Pulmonary effort is normal. No respiratory distress.  ?   Breath sounds: Normal breath sounds.  ?  Comments: Tender left lateral ribs.  No crepitus.  No rash. ?Chest:  ?   Chest wall: Tenderness present.  ?Abdominal:  ?   Palpations: Abdomen is soft.  ?   Tenderness: There is no abdominal tenderness. There is no guarding or rebound.  ?Musculoskeletal:     ?   General: No tenderness. Normal range of motion.  ?   Cervical back: Normal range of motion and neck supple.  ?Skin: ?   General: Skin is warm.  ?Neurological:  ?   Mental Status: She is alert and oriented to person, place, and time.  ?   Cranial Nerves: No cranial nerve deficit.  ?   Motor: No abnormal muscle tone.  ?   Coordination: Coordination normal.  ?   Comments:  5/5 strength throughout. CN 2-12 intact.Equal grip strength.   ?Psychiatric:      ?   Behavior: Behavior normal.  ? ? ?ED Results / Procedures / Treatments   ?Labs ?(all labs ordered are listed, but only abnormal results are displayed) ?Labs Reviewed  ?D-DIMER, QUANTITATIVE - Abnormal; Notable for the following components:  ?    Result Value  ? D-Dimer, Quant 1.49 (*)   ? All other components within normal limits  ?CBC WITH DIFFERENTIAL/PLATELET  ?BASIC METABOLIC PANEL  ? ? ?EKG ?EKG Interpretation ? ?Date/Time:  Monday Sep 12 2021 21:43:26 EDT ?Ventricular Rate:  74 ?PR Interval:  133 ?QRS Duration: 81 ?QT Interval:  356 ?QTC Calculation: 395 ?R Axis:   78 ?Text Interpretation: Sinus rhythm Consider left ventricular hypertrophy Nonspecific T abnrm, anterolateral leads ST elev, probable normal early repol pattern No previous ECGs available Confirmed by Ezequiel Essex [phone]) on 09/12/2021 10:32:36 PM ? ?Radiology ?DG Ribs Unilateral W/Chest Left ? ?Result Date: 09/12/2021 ?CLINICAL DATA:  Left rib pain for 4 days EXAM: LEFT RIBS AND CHEST - 3+ VIEW COMPARISON:  01/15/2017, 09/11/2017 FINDINGS: Frontal view of the chest as well as frontal and oblique views of the left thoracic cage are obtained. Cardiac silhouette is unremarkable. Linear consolidation in the left perihilar region most consistent with discoid atelectasis or scarring. No airspace disease, effusion, or pneumothorax. Left chest wall port via subclavian approach tip overlies superior vena cava. There are no acute displaced fractures. Bony structures are grossly unremarkable. IMPRESSION: 1. Left perihilar atelectasis versus scarring. 2. No acute displaced fracture. Electronically Signed   By: Randa Ngo M.D.   On: 09/12/2021 22:03  ? ?CT Angio Chest PE W and/or Wo Contrast ? ?Result Date: 09/12/2021 ?CLINICAL DATA:  Positive D-dimer with left-sided chest pain. History of breast cancer. EXAM: CT ANGIOGRAPHY CHEST WITH CONTRAST TECHNIQUE: Multidetector CT imaging of the chest was performed using the standard protocol during bolus  administration of intravenous contrast. Multiplanar CT image reconstructions and MIPs were obtained to evaluate the vascular anatomy. RADIATION DOSE REDUCTION: This exam was performed according to the departmental dose-optimization program which includes automated exposure control, adjustment of the mA and/or kV according to patient size and/or use of iterative reconstruction technique. CONTRAST:  19m OMNIPAQUE IOHEXOL 350 MG/ML SOLN COMPARISON:  None. FINDINGS: Cardiovascular: Satisfactory opacification of the pulmonary arteries to the segmental level. No evidence of pulmonary embolism. Normal heart size. No pericardial effusion. Mediastinum/Nodes: No enlarged mediastinal, hilar, or axillary lymph nodes. Thyroid gland, trachea, and esophagus demonstrate no significant findings. Lungs/Pleura: There is a 6 mm left lower lobe nodule image 3/47. There is a lobulated area of airspace disease versus clustered nodules in the right lower lobe with largest nodular  component measuring 9 mm on image 8/31. There is also focal nodular density in the superior segment of the left lower lobe measuring 1.6 x 1.3 cm with mild surrounding ground-glass opacity. There is minimal dependent atelectasis bilaterally. There is no pleural effusion or pneumothorax. Upper Abdomen: There surgical clips in the left lobe of the liver. Musculoskeletal: Bilateral breast implants are present. No acute fractures are seen. No suspicious osseous lesions. Review of the MIP images confirms the above findings. IMPRESSION: 1. No evidence for pulmonary embolism. 2. Cluster of nodular densities in the right lower lobe may represent infection or metastatic disease. Largest nodular component measures 9 mm. 3. There are 2 additional nodular densities in the left lung measuring up to 16 mm. Findings may be infectious or related to metastatic disease. Comparison with priors would be useful. Electronically Signed   By: Ronney Asters M.D.   On: 09/12/2021 23:34    ? ?Procedures ?Procedures  ? ? ?Medications Ordered in ED ?Medications  ?HYDROcodone-acetaminophen (NORCO/VICODIN) 5-325 MG per tablet 1 tablet (has no administration in time range)  ? ? ?ED Course/ Medical Decision Ma

## 2021-09-12 NOTE — ED Triage Notes (Signed)
Sharp left rib cage pain that started approx 4 days ago. Denies injury. Pt has been applying heat, ice, Ibuprofen, and BioFreeze. No relief.  ?

## 2021-09-12 NOTE — Discharge Instructions (Addendum)
Your testing today shows no pneumonia, no blood clot in the lung, no pneumothorax.  As we discussed you have multiple nodules in both lungs which are similar to your scans done at [facility] in January but I cannot compare the images side-by-side to be certain.  Follow-up with your oncologist regarding the nodules in your lungs from further evaluation for these. ?Take the pain medication as prescribed.  Return to the ED with difficulty breathing, worsening pain, fever, vomiting, other concerns. ? ?Although your initial troponin blood test did not show any sign of a heart attack, we cannot be 100% sure that she did not have a heart attack without a repeat troponin test.  You have chosen not to get that repeat test.  Please be aware that there is a small risk that you could actually have had a heart attack.  If you change your mind about the test, you are welcome to return at any time. ?

## 2021-09-12 NOTE — ED Notes (Signed)
Patient transported to CT 

## 2021-09-13 LAB — TROPONIN I (HIGH SENSITIVITY): Troponin I (High Sensitivity): 3 ng/L (ref <18)

## 2021-09-13 NOTE — ED Notes (Signed)
Pt agreeable with d/c plan as discussed by provider- this nurse has verbally reinforced d/c instructions and provided written copy - pt acknowledges verbal understanding and denies any additional questions, concerns, needs- ambulatory independently at d/c with slow but steady gait; vitals stable; no distress - escorted by spouse  ?

## 2021-09-13 NOTE — ED Provider Notes (Signed)
Patient is refusing second troponin draw.  I have gone into explained to the patient that there is an approximately 2% risk of actually having a heart attack with negative initial troponin and that without the repeat troponin I cannot be sure that she did not have a heart attack.  Patient and her husband expressed understanding and still wished to leave without having the repeat troponin test done.  They are aware that they are welcome to return if they change their mind. ?  ?Delora Fuel, MD ?75/64/33 0031 ? ?

## 2021-09-13 NOTE — ED Notes (Signed)
This nurse entered room to collect repeat trop however pt refusing -- this nurse has communicated to ED provider now on duty -- Dr Roxanne Mins now at bedside.   ?

## 2023-04-15 DEATH — deceased

## 2024-04-30 IMAGING — CT CT ANGIO CHEST
2 of 8 series · 18 of 36 positions shown · IV contrast (Omnipaque)
Comparison: None.

CLINICAL DATA: Positive D-dimer with left-sided chest pain. History
of breast cancer.

EXAM:
CT ANGIOGRAPHY CHEST WITH CONTRAST
TECHNIQUE: Multidetector CT imaging of the chest was performed using the
standard protocol during bolus administration of intravenous
contrast. Multiplanar CT image reconstructions and MIPs were
obtained to evaluate the vascular anatomy.

[Series 7: pe thins · axial · 0.79mm/px · z∈[+946,+1148]mm · 17 of 226 slices shown]
[im 12/226  lung]
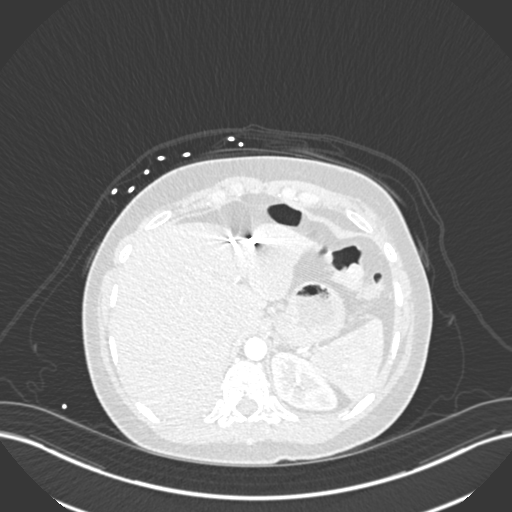
[im 24/226  mediastinal]
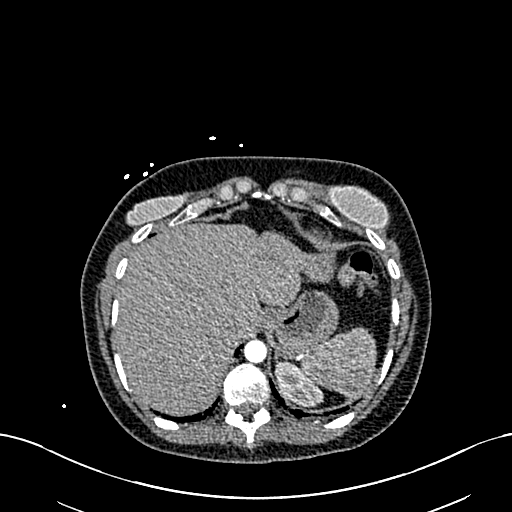
[im 36/226  lung]
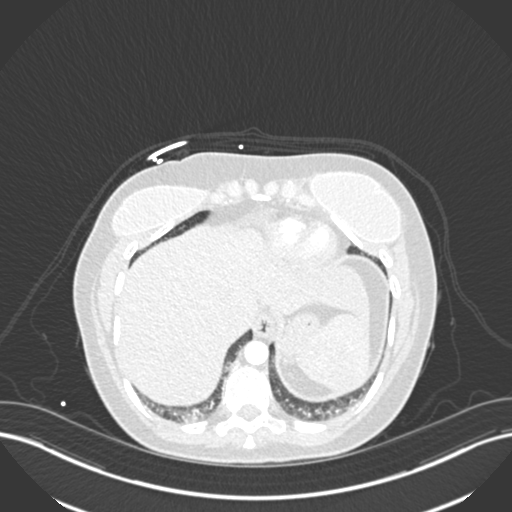
[im 48/226  mediastinal]
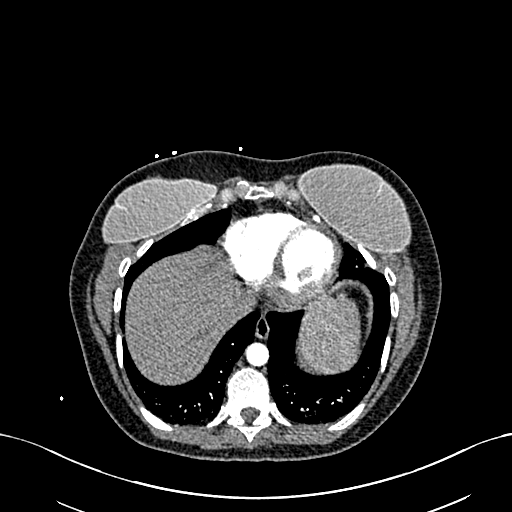
[im 60/226  lung]
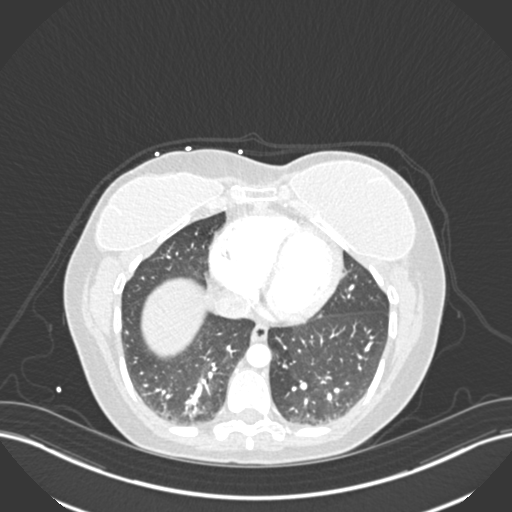
[im 72/226  mediastinal]
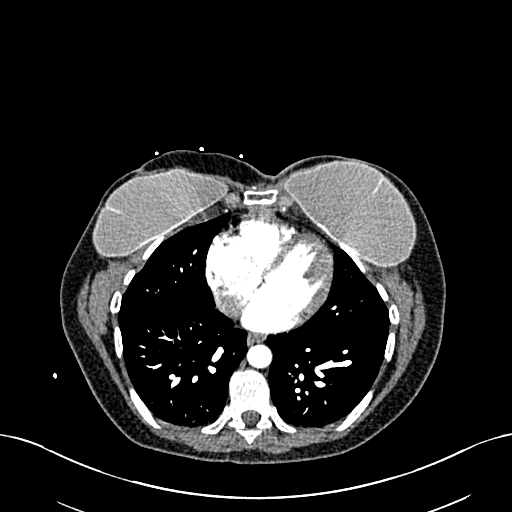
[im 83/226  lung]
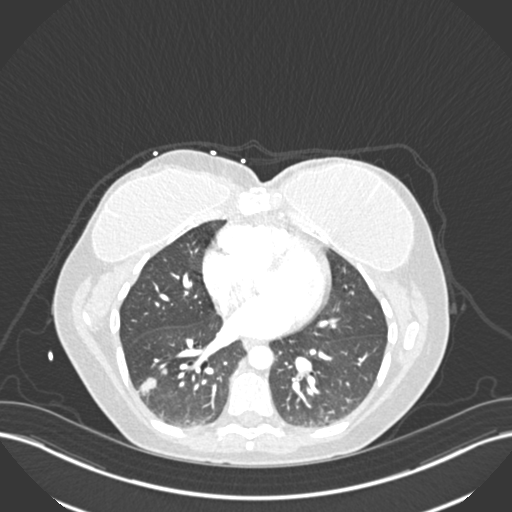
[im 95/226  mediastinal]
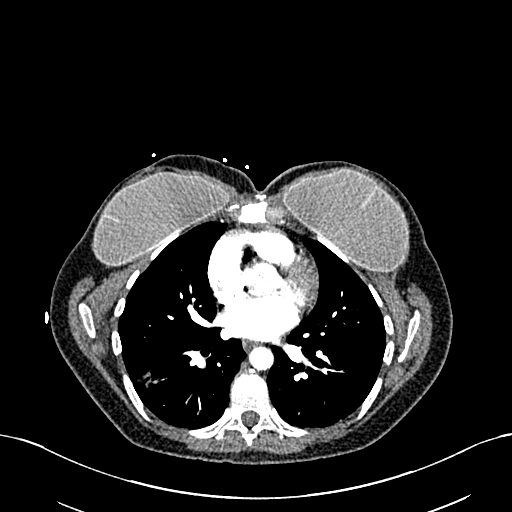
[im 119/226  lung]
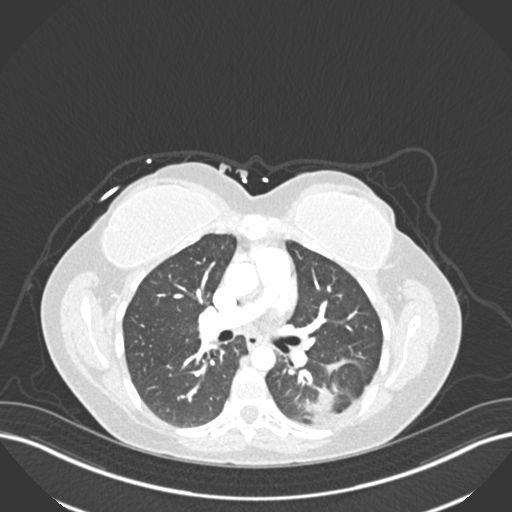
[im 131/226  mediastinal]
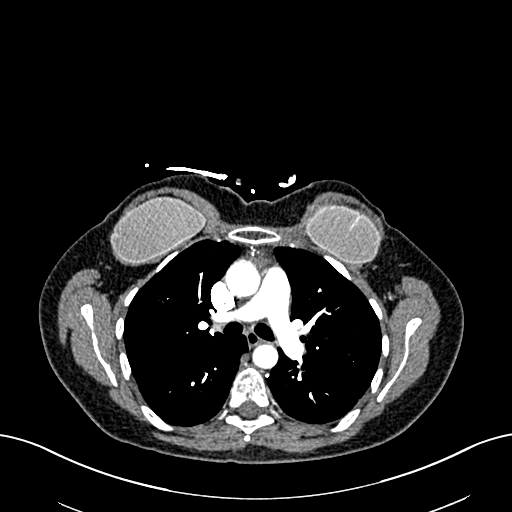
[im 143/226  lung]
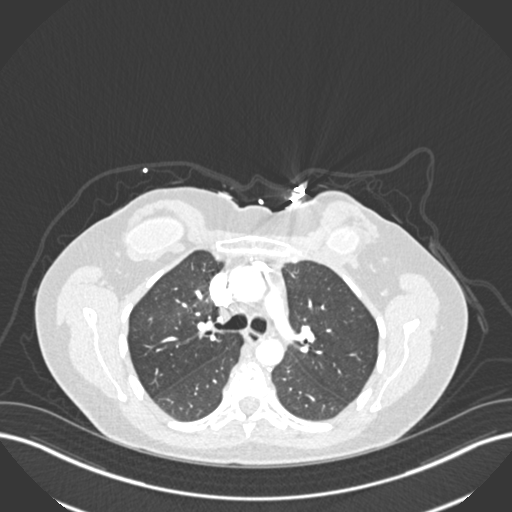
[im 154/226  mediastinal]
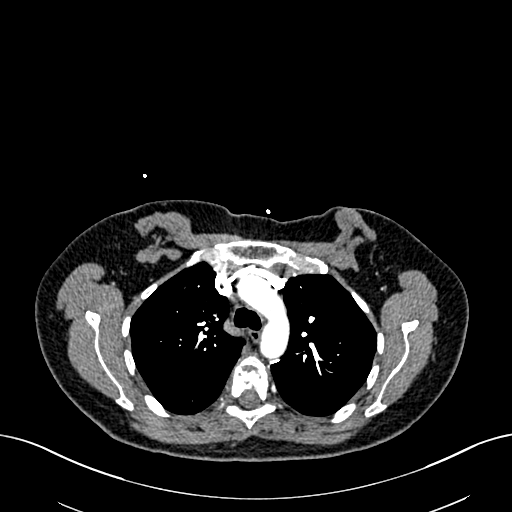
[im 166/226  lung]
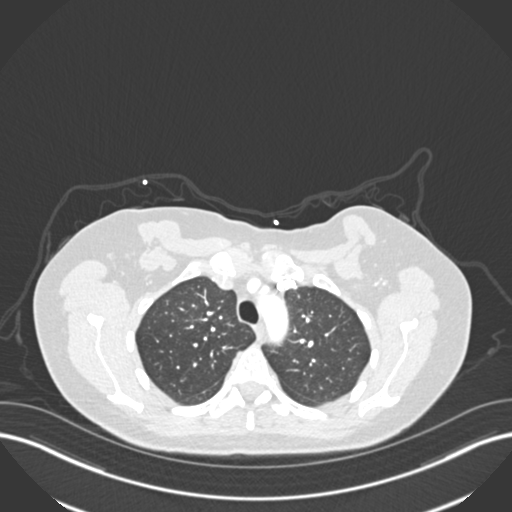
[im 178/226  mediastinal]
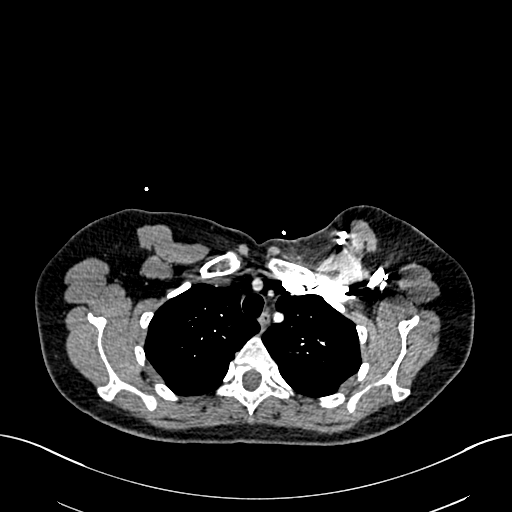
[im 190/226  lung]
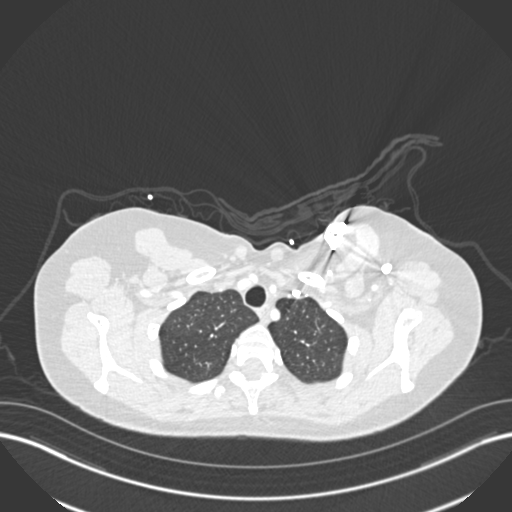
[im 202/226  mediastinal]
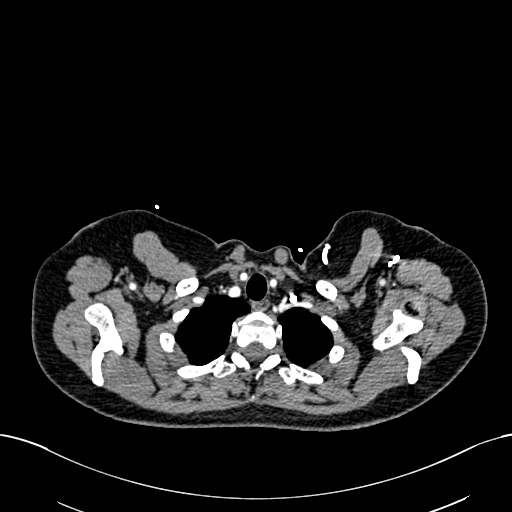
[im 214/226  lung]
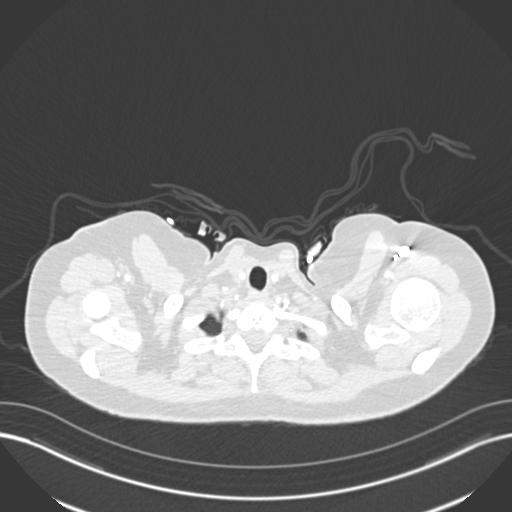

[Series 8: pe coronal mpr · coronal · 0.45mm/px · 1 of 123 slices shown]
[im 62/123  mediastinal]
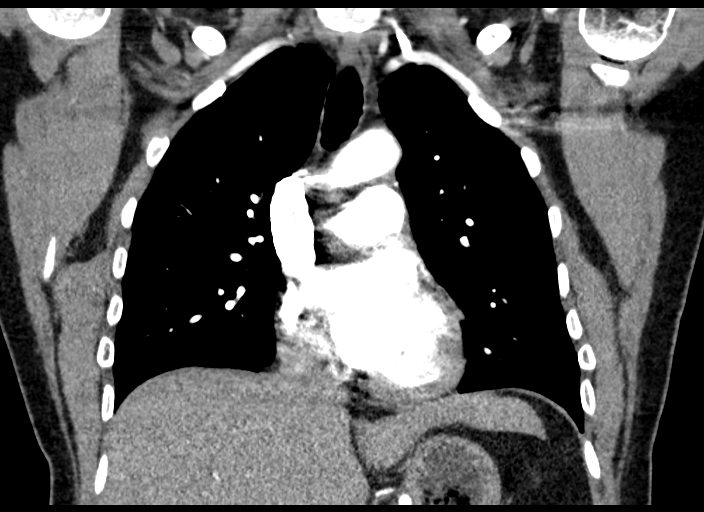

[18 of 36 positions shown; findings below may reference images not displayed]

RADIATION DOSE REDUCTION: This exam was performed according to the
departmental dose-optimization program which includes automated
exposure control, adjustment of the mA and/or kV according to
patient size and/or use of iterative reconstruction technique.

CONTRAST:  75mL OMNIPAQUE IOHEXOL 350 MG/ML SOLN
FINDINGS: Cardiovascular: Satisfactory opacification of the pulmonary arteries
to the segmental level. No evidence of pulmonary embolism. Normal
heart size. No pericardial effusion.

Mediastinum/Nodes: No enlarged mediastinal, hilar, or axillary lymph
nodes. Thyroid gland, trachea, and esophagus demonstrate no
significant findings.

Lungs/Pleura: There is a 6 mm left lower lobe nodule image 3/47.
There is a lobulated area of airspace disease versus clustered
nodules in the right lower lobe with largest nodular component
measuring 9 mm on image [DATE]. There is also focal nodular density in
the superior segment of the left lower lobe measuring 1.6 x 1.3 cm
with mild surrounding ground-glass opacity. There is minimal
dependent atelectasis bilaterally. There is no pleural effusion or
pneumothorax.

Upper Abdomen: There surgical clips in the left lobe of the liver.

Musculoskeletal: Bilateral breast implants are present. No acute
fractures are seen. No suspicious osseous lesions.

Review of the MIP images confirms the above findings.
IMPRESSION: 1. No evidence for pulmonary embolism.
2. Cluster of nodular densities in the right lower lobe may
represent infection or metastatic disease. Largest nodular component
measures 9 mm.
3. There are 2 additional nodular densities in the left lung
measuring up to 16 mm. Findings may be infectious or related to
metastatic disease. Comparison with priors would be useful.
